# Patient Record
Sex: Female | Born: 1970 | Race: White | Hispanic: No | Marital: Married | Smoking: Current every day smoker
Health system: Southern US, Community
[De-identification: ages and names within clinical notes are randomized; demographics above are authoritative.]

## PROBLEM LIST (undated history)

## (undated) DIAGNOSIS — M797 Fibromyalgia: Secondary | ICD-10-CM

## (undated) DIAGNOSIS — R112 Nausea with vomiting, unspecified: Secondary | ICD-10-CM

## (undated) DIAGNOSIS — Z9889 Other specified postprocedural states: Secondary | ICD-10-CM

## (undated) DIAGNOSIS — Z973 Presence of spectacles and contact lenses: Secondary | ICD-10-CM

## (undated) DIAGNOSIS — M1990 Unspecified osteoarthritis, unspecified site: Secondary | ICD-10-CM

## (undated) DIAGNOSIS — R350 Frequency of micturition: Secondary | ICD-10-CM

## (undated) DIAGNOSIS — Z8701 Personal history of pneumonia (recurrent): Secondary | ICD-10-CM

## (undated) DIAGNOSIS — F410 Panic disorder [episodic paroxysmal anxiety] without agoraphobia: Secondary | ICD-10-CM

## (undated) HISTORY — PX: TONSILLECTOMY: SUR1361

## (undated) HISTORY — PX: EXTERNAL EAR SURGERY: SHX627

## (undated) HISTORY — PX: TUBAL LIGATION: SHX77

## (undated) HISTORY — PX: KNEE ARTHROSCOPY: SUR90

## (undated) HISTORY — PX: INNER EAR SURGERY: SHX679

---

## 2016-06-24 ENCOUNTER — Other Ambulatory Visit: Payer: Self-pay | Admitting: Orthopedic Surgery

## 2016-07-12 NOTE — Pre-Procedure Instructions (Signed)
Michaela Lawson  07/12/2016      [facility] [place] [address] [place] [address] Phone: [phone] Fax: [phone]    Your procedure is scheduled on Friday, August 18th, 2017.  [facility] Admitting at 11:00 A.M.   Call this number if you have problems the morning of surgery:  [phone]   Remember:  Do not eat food or drink liquids after midnight.   Take these medicines the morning of surgery with A SIP OF WATER: Cyclobenzaprine (Flexeril) if needed.  7 days prior to surgery, stop taking: Meloxicam (Mobic), Aspirin, NSAIDS, Aleve, Naproxen, Ibuprofen, Advil, Motrin, BC's, Goody's, Fish oil, all herbal medications, and all vitamins.    Do not wear jewelry, make-up or nail polish.  Do not wear lotions, powders, or perfumes.   Do not shave 48 hours prior to surgery.    Do not bring valuables to the hospital.  [facility] is not responsible for any belongings or valuables.  Contacts, dentures or bridgework may not be worn into surgery.  Leave your suitcase in the car.  After surgery it may be brought to your room.  For patients admitted to the hospital, discharge time will be determined by your treatment team.  Patients discharged the day of surgery will not be allowed to drive home.   Special instructions:  Preparing for Surgery.   Please read over the following fact sheets that you were given. MRSA Information    [facility]- Preparing For Surgery  Before surgery, you can play an important role. Because skin is not sterile, your skin needs to be as free of germs as possible. You can reduce the number of germs on your skin by washing with CHG (chlorahexidine gluconate) Soap before surgery.  CHG is an antiseptic cleaner which kills germs and bonds with the skin to continue killing germs even after washing.  Please do not use if you have an allergy to CHG or antibacterial soaps. If your  skin becomes reddened/irritated stop using the CHG.  Do not shave (including legs and underarms) for at least 48 hours prior to first CHG shower. It is OK to shave your face.  Please follow these instructions carefully.   1. Shower the NIGHT BEFORE SURGERY and the MORNING OF SURGERY with CHG.   2. If you chose to wash your hair, wash your hair first as usual with your normal shampoo.  3. After you shampoo, rinse your hair and body thoroughly to remove the shampoo.  4. Use CHG as you would any other liquid soap. You can apply CHG directly to the skin and wash gently with a scrungie or a clean washcloth.   5. Apply the CHG Soap to your body ONLY FROM THE NECK DOWN.  Do not use on open wounds or open sores. Avoid contact with your eyes, ears, mouth and genitals (private parts). Wash genitals (private parts) with your normal soap.  6. Wash thoroughly, paying special attention to the area where your surgery will be performed.  7. Thoroughly rinse your body with warm water from the neck down.  8. DO NOT shower/wash with your normal soap after using and rinsing off the CHG Soap.  9. Pat yourself dry with a CLEAN TOWEL.   10. Wear CLEAN PAJAMAS   11. Place CLEAN SHEETS on your bed the night of your first shower and DO NOT SLEEP WITH PETS.  Day of Surgery: Do not apply  any deodorants/lotions. Please wear clean clothes to the hospital/surgery center.

## 2016-07-13 ENCOUNTER — Encounter (HOSPITAL_COMMUNITY): Payer: Self-pay | Admitting: General Practice

## 2016-07-13 ENCOUNTER — Encounter (HOSPITAL_COMMUNITY)
Admission: RE | Admit: 2016-07-13 | Discharge: 2016-07-13 | Disposition: A | Discharge status: Home / Self Care | Payer: Worker's Compensation | Source: Ambulatory Visit | Attending: Orthopedic Surgery | Admitting: Orthopedic Surgery

## 2016-07-13 ENCOUNTER — Ambulatory Visit (HOSPITAL_COMMUNITY)
Admission: RE | Admit: 2016-07-13 | Discharge: 2016-07-13 | Disposition: A | Discharge status: Home / Self Care | Payer: Worker's Compensation | Source: Ambulatory Visit | Attending: Orthopedic Surgery | Admitting: Orthopedic Surgery

## 2016-07-13 DIAGNOSIS — Z01818 Encounter for other preprocedural examination: Secondary | ICD-10-CM

## 2016-07-13 DIAGNOSIS — Z01810 Encounter for preprocedural cardiovascular examination: Secondary | ICD-10-CM | POA: Diagnosis not present

## 2016-07-13 HISTORY — DX: Fibromyalgia: M79.7

## 2016-07-13 HISTORY — DX: Other specified postprocedural states: R11.2

## 2016-07-13 HISTORY — DX: Other specified postprocedural states: Z98.890

## 2016-07-13 HISTORY — DX: Presence of spectacles and contact lenses: Z97.3

## 2016-07-13 HISTORY — DX: Unspecified osteoarthritis, unspecified site: M19.90

## 2016-07-13 HISTORY — DX: Panic disorder (episodic paroxysmal anxiety): F41.0

## 2016-07-13 HISTORY — DX: Personal history of pneumonia (recurrent): Z87.01

## 2016-07-13 HISTORY — DX: Frequency of micturition: R35.0

## 2016-07-13 LAB — URINALYSIS, ROUTINE W REFLEX MICROSCOPIC
BILIRUBIN URINE: NEGATIVE
GLUCOSE, UA: NEGATIVE mg/dL
HGB URINE DIPSTICK: NEGATIVE
KETONES UR: NEGATIVE mg/dL
Leukocytes, UA: NEGATIVE
Nitrite: NEGATIVE
PROTEIN: NEGATIVE mg/dL
Specific Gravity, Urine: 1.002 — ABNORMAL LOW (ref 1.005–1.030)
pH: 7.5 (ref 5.0–8.0)

## 2016-07-13 LAB — BASIC METABOLIC PANEL
ANION GAP: 9 (ref 5–15)
BUN: 6 mg/dL (ref 6–20)
CALCIUM: 9 mg/dL (ref 8.9–10.3)
CO2: 27 mmol/L (ref 22–32)
Chloride: 101 mmol/L (ref 101–111)
Creatinine, Ser: 0.83 mg/dL (ref 0.44–1.00)
GLUCOSE: 141 mg/dL — AB (ref 65–99)
POTASSIUM: 3.8 mmol/L (ref 3.5–5.1)
Sodium: 137 mmol/L (ref 135–145)

## 2016-07-13 LAB — CBC WITH DIFFERENTIAL/PLATELET
BASOS ABS: 0 10*3/uL (ref 0.0–0.1)
BASOS PCT: 0 %
Eosinophils Absolute: 0.1 10*3/uL (ref 0.0–0.7)
Eosinophils Relative: 1 %
HEMATOCRIT: 43.5 % (ref 36.0–46.0)
HEMOGLOBIN: 15.1 g/dL — AB (ref 12.0–15.0)
LYMPHS PCT: 22 %
Lymphs Abs: 2 10*3/uL (ref 0.7–4.0)
MCH: 34.9 pg — ABNORMAL HIGH (ref 26.0–34.0)
MCHC: 34.7 g/dL (ref 30.0–36.0)
MCV: 100.5 fL — AB (ref 78.0–100.0)
MONO ABS: 0.4 10*3/uL (ref 0.1–1.0)
MONOS PCT: 5 %
NEUTROS ABS: 6.8 10*3/uL (ref 1.7–7.7)
NEUTROS PCT: 72 %
Platelets: 345 10*3/uL (ref 150–400)
RBC: 4.33 MIL/uL (ref 3.87–5.11)
RDW: 12.5 % (ref 11.5–15.5)
WBC: 9.3 10*3/uL (ref 4.0–10.5)

## 2016-07-13 LAB — TYPE AND SCREEN
ABO/RH(D): A POS
Antibody Screen: NEGATIVE

## 2016-07-13 LAB — PROTIME-INR
INR: 0.95
Prothrombin Time: 12.7 seconds (ref 11.4–15.2)

## 2016-07-13 LAB — ABO/RH: ABO/RH(D): A POS

## 2016-07-13 LAB — SURGICAL PCR SCREEN
MRSA, PCR: NEGATIVE
Staphylococcus aureus: NEGATIVE

## 2016-07-13 LAB — APTT: aPTT: 30 seconds (ref 24–36)

## 2016-07-13 LAB — HCG, SERUM, QUALITATIVE: PREG SERUM: NEGATIVE

## 2016-07-13 NOTE — Progress Notes (Signed)
PCP - Dr. Crista ElliotJames McGrath - pt. States that she has not seen him since 2014 Cardiologist - denies  EKG - 07/13/16 CXR - 07/13/16  Echo/stress test/Cardiac Cath - pt. Denies  Patient denies chest pain and shortness of breath at PAT appointment.

## 2016-07-18 DIAGNOSIS — M1611 Unilateral primary osteoarthritis, right hip: Secondary | ICD-10-CM | POA: Diagnosis present

## 2016-07-21 NOTE — H&P (Signed)
TOTAL HIP ADMISSION H&P  Patient is admitted for right total hip arthroplasty.  Subjective:  Chief Complaint: right hip pain  HPI: Michaela Lawson, 45 y.o. female, has a history of pain and functional disability in the right hip(s) due to trauma and arthritis and patient has failed non-surgical conservative treatments for greater than 12 weeks to include NSAID's and/or analgesics, corticosteriod injections, flexibility and strengthening excercises, use of assistive devices, weight reduction as appropriate and activity modification.  Onset of symptoms was abrupt starting 1 years ago with rapidlly worsening course since that time.The patient noted no past surgery on the right hip(s).  Patient currently rates pain in the right hip at 10 out of 10 with activity. Patient has night pain, worsening of pain with activity and weight bearing, pain that interfers with activities of daily living, pain with passive range of motion and crepitus. Patient has evidence of subchondral cysts and joint space narrowing by imaging studies. This condition presents safety issues increasing the risk of falls.   There is no current active infection.  Patient Active Problem List   Diagnosis Date Noted  . Primary localized osteoarthritis of right hip 07/18/2016   Past Medical History:  Diagnosis Date  . Arthritis   . Fibromyalgia   . History of pneumonia   . Panic attacks   . PONV (postoperative nausea and vomiting)   . Urinary frequency   . Wears glasses     Past Surgical History:  Procedure Laterality Date  . EXTERNAL EAR SURGERY Right   . INNER EAR SURGERY     x2  . KNEE ARTHROSCOPY Left   . TONSILLECTOMY    . TUBAL LIGATION      No prescriptions prior to admission.   No Known Allergies  Social History  Substance Use Topics  . Smoking status: Current Every Day Smoker    Packs/day: 1.00    Types: Cigarettes  . Smokeless tobacco: Never Used  . Alcohol use 3.6 - 4.8 oz/week    6 - 8 Cans of beer per week     Comment: weekends    No family history on file.   Review of Systems  Constitutional: Positive for malaise/fatigue.  HENT: Positive for tinnitus.   Eyes: Positive for blurred vision.  Respiratory: Positive for shortness of breath.   Cardiovascular: Negative.   Gastrointestinal:       Irritable bowel  Genitourinary: Positive for frequency.  Musculoskeletal: Positive for joint pain and myalgias.  Neurological: Positive for dizziness, focal weakness and headaches.  Endo/Heme/Allergies: Bruises/bleeds easily.  Psychiatric/Behavioral: Positive for depression. The patient is nervous/anxious.     Objective:  Physical Exam  Constitutional: She is oriented to person, place, and time. She appears well-developed and well-nourished.  HENT:  Head: Normocephalic and atraumatic.  Eyes: Pupils are equal, round, and reactive to light.  Neck: Normal range of motion. Neck supple.  Cardiovascular: Intact distal pulses.   Respiratory: Effort normal.  Musculoskeletal: She exhibits tenderness.  she does continue to have moderate irritation with hip flexion extension internal and external rotation and log roll of the right hip.  Increased pain with palpation of the right groin.  Patient's left hip continues to have good strength and good range of motion.  Neurological: She is alert and oriented to person, place, and time.  Skin: Skin is warm and dry.  Psychiatric: She has a normal mood and affect. Her behavior is normal. Judgment and thought content normal.    Vital signs in last 24 hours:  Labs:   Estimated body mass index is 19.92 kg/m as calculated from the following:   Height as of 07/13/16: 5\' 5"  (1.651 m).   Weight as of 07/13/16: 54.3 kg (119 lb 11.2 oz).   Imaging Review She did have an MRI previously showing a superior labral tear with parallel labral cyst and moderate arthritis.    Assessment/Plan:  End stage arthritis, right hip(s)  The patient history, physical examination,  clinical judgement of the provider and imaging studies are consistent with end stage degenerative joint disease of the right hip(s) and total hip arthroplasty is deemed medically necessary. The treatment options including medical management, injection therapy, arthroscopy and arthroplasty were discussed at length. The risks and benefits of total hip arthroplasty were presented and reviewed. The risks due to aseptic loosening, infection, stiffness, dislocation/subluxation,  thromboembolic complications and other imponderables were discussed.  The patient acknowledged the explanation, agreed to proceed with the plan and consent was signed. Patient is being admitted for inpatient treatment for surgery, pain control, PT, OT, prophylactic antibiotics, VTE prophylaxis, progressive ambulation and ADL's and discharge planning.The patient is planning to be discharged home with home health services

## 2016-07-22 MED ORDER — BUPIVACAINE LIPOSOME 1.3 % IJ SUSP
20.0000 mL | Freq: Once | INTRAMUSCULAR | Status: DC
  Filled 2016-07-22: qty 20

## 2016-07-22 MED ORDER — TRANEXAMIC ACID 1000 MG/10ML IV SOLN
1000.0000 mg | INTRAVENOUS | Status: AC
  Administered 2016-07-23: 1000 mg via INTRAVENOUS
  Filled 2016-07-22: qty 10

## 2016-07-22 MED ORDER — TRANEXAMIC ACID 1000 MG/10ML IV SOLN
2000.0000 mg | Freq: Once | INTRAVENOUS | Status: DC
  Filled 2016-07-22: qty 20

## 2016-07-22 NOTE — Anesthesia Preprocedure Evaluation (Addendum)
Anesthesia Evaluation  Patient identified by MRN, date of birth, ID band Patient awake    Reviewed: Allergy & Precautions, NPO status , Patient's Chart, lab work & pertinent test results  History of Anesthesia Complications (+) PONV and history of anesthetic complications (patient denies PONV but listed in chart)  Airway Mallampati: III  TM Distance: >3 FB Neck ROM: Full    Dental  (+) Dental Advisory Given,    Pulmonary neg shortness of breath, neg sleep apnea, neg COPD, neg recent URI, Current Smoker,    Pulmonary exam normal breath sounds clear to auscultation       Cardiovascular (-) hypertension(-) angina(-) Past MI, (-) Cardiac Stents, (-) CABG, (-) Orthopnea and (-) PND  Rhythm:Regular Rate:Normal  EKG 07/13/2016: NSR   Neuro/Psych neg Seizures PSYCHIATRIC DISORDERS Anxiety Panic attacks   GI/Hepatic negative GI ROS, Neg liver ROS, neg GERD  ,  Endo/Other  negative endocrine ROSneg diabetes  Renal/GU negative Renal ROS  Female GU complaint (frequency)     Musculoskeletal  (+) Arthritis , Osteoarthritis,  Fibromyalgia -  Abdominal (+) - obese,   Peds  Hematology negative hematology ROS (+)   Anesthesia Other Findings   Reproductive/Obstetrics                            Anesthesia Physical Anesthesia Plan  ASA: II  Anesthesia Plan: Spinal   Post-op Pain Management:    Induction: Intravenous  Airway Management Planned: Natural Airway and Nasal Cannula  Additional Equipment:   Intra-op Plan:   Post-operative Plan:   Informed Consent: I have reviewed the patients History and Physical, chart, labs and discussed the procedure including the risks, benefits and alternatives for the proposed anesthesia with the patient or authorized representative who has indicated his/her understanding and acceptance.   Dental advisory given  Plan Discussed with:   Anesthesia Plan Comments: (I  have discussed risks of neuraxial anesthesia including but not limited to infection, bleeding, nerve injury, back pain, headache, seizures, and failure of block. Patient denies bleeding disorders and is not currently anticoagulated. Labs have been reviewed. Risks and benefits discussed. All patient's questions answered.   Hgb 15.1 Platelets 345 INR 0.95)       Anesthesia Quick Evaluation

## 2016-07-23 ENCOUNTER — Encounter (HOSPITAL_COMMUNITY): Payer: Self-pay | Admitting: Surgery

## 2016-07-23 ENCOUNTER — Encounter (HOSPITAL_COMMUNITY)
Admission: RE | Disposition: A | Discharge status: Home Health | Payer: Self-pay | Source: Ambulatory Visit | Attending: Orthopedic Surgery

## 2016-07-23 ENCOUNTER — Inpatient Hospital Stay (HOSPITAL_COMMUNITY): Payer: Worker's Compensation | Admitting: Anesthesiology

## 2016-07-23 ENCOUNTER — Inpatient Hospital Stay (HOSPITAL_COMMUNITY): Payer: Worker's Compensation

## 2016-07-23 ENCOUNTER — Inpatient Hospital Stay (HOSPITAL_COMMUNITY)
Admission: RE | Admit: 2016-07-23 | Discharge: 2016-07-25 | DRG: 470 | Disposition: A | Discharge status: Home Health | Payer: Worker's Compensation | Source: Ambulatory Visit | Attending: Orthopedic Surgery | Admitting: Orthopedic Surgery

## 2016-07-23 DIAGNOSIS — M1611 Unilateral primary osteoarthritis, right hip: Principal | ICD-10-CM | POA: Diagnosis present

## 2016-07-23 DIAGNOSIS — F410 Panic disorder [episodic paroxysmal anxiety] without agoraphobia: Secondary | ICD-10-CM | POA: Diagnosis present

## 2016-07-23 DIAGNOSIS — F17200 Nicotine dependence, unspecified, uncomplicated: Secondary | ICD-10-CM | POA: Diagnosis present

## 2016-07-23 DIAGNOSIS — Z8701 Personal history of pneumonia (recurrent): Secondary | ICD-10-CM | POA: Diagnosis not present

## 2016-07-23 DIAGNOSIS — M797 Fibromyalgia: Secondary | ICD-10-CM | POA: Diagnosis present

## 2016-07-23 DIAGNOSIS — Z419 Encounter for procedure for purposes other than remedying health state, unspecified: Secondary | ICD-10-CM

## 2016-07-23 HISTORY — PX: TOTAL HIP ARTHROPLASTY: SHX124

## 2016-07-23 SURGERY — ARTHROPLASTY, HIP, TOTAL, ANTERIOR APPROACH
Anesthesia: Spinal | Site: Hip | Laterality: Right

## 2016-07-23 MED ORDER — MIDAZOLAM HCL 5 MG/5ML IJ SOLN
INTRAMUSCULAR | Status: DC | PRN
  Administered 2016-07-23: 2 mg via INTRAVENOUS

## 2016-07-23 MED ORDER — ROCURONIUM BROMIDE 100 MG/10ML IV SOLN
INTRAVENOUS | Status: DC | PRN
  Administered 2016-07-23: 20 mg via INTRAVENOUS
  Administered 2016-07-23: 10 mg via INTRAVENOUS

## 2016-07-23 MED ORDER — BUPIVACAINE HCL (PF) 0.5 % IJ SOLN
INTRAMUSCULAR | Status: DC | PRN
  Administered 2016-07-23: 12.5 mg via INTRATHECAL

## 2016-07-23 MED ORDER — PROPOFOL 10 MG/ML IV BOLUS
INTRAVENOUS | Status: DC | PRN
  Administered 2016-07-23: 120 mg via INTRAVENOUS

## 2016-07-23 MED ORDER — BISACODYL 5 MG PO TBEC: 5.0000 mg | DELAYED_RELEASE_TABLET | Freq: Every day | ORAL | Status: DC | PRN

## 2016-07-23 MED ORDER — METOCLOPRAMIDE HCL 5 MG PO TABS: 5.0000 mg | ORAL_TABLET | Freq: Three times a day (TID) | ORAL | Status: DC | PRN

## 2016-07-23 MED ORDER — PNEUMOCOCCAL VAC POLYVALENT 25 MCG/0.5ML IJ INJ: 0.5000 mL | INJECTION | INTRAMUSCULAR | Status: DC

## 2016-07-23 MED ORDER — BUPIVACAINE-EPINEPHRINE (PF) 0.25% -1:200000 IJ SOLN
INTRAMUSCULAR | Status: AC
  Filled 2016-07-23: qty 30

## 2016-07-23 MED ORDER — PROPOFOL 500 MG/50ML IV EMUL
INTRAVENOUS | Status: DC | PRN
  Administered 2016-07-23: 25 ug/kg/min via INTRAVENOUS

## 2016-07-23 MED ORDER — FLEET ENEMA 7-19 GM/118ML RE ENEM: 1.0000 | ENEMA | Freq: Once | RECTAL | Status: DC | PRN

## 2016-07-23 MED ORDER — ACETAMINOPHEN 650 MG RE SUPP: 650.0000 mg | Freq: Four times a day (QID) | RECTAL | Status: DC | PRN

## 2016-07-23 MED ORDER — BUPIVACAINE LIPOSOME 1.3 % IJ SUSP
INTRAMUSCULAR | Status: DC | PRN
  Administered 2016-07-23: 20 mL

## 2016-07-23 MED ORDER — LACTATED RINGERS IV SOLN
INTRAVENOUS | Status: DC
  Administered 2016-07-23 (×2): via INTRAVENOUS

## 2016-07-23 MED ORDER — FENTANYL CITRATE (PF) 100 MCG/2ML IJ SOLN
INTRAMUSCULAR | Status: AC
Start: 2016-07-23 — End: 2016-07-23
  Filled 2016-07-23: qty 2

## 2016-07-23 MED ORDER — DEXTROSE-NACL 5-0.45 % IV SOLN: INTRAVENOUS | Status: DC

## 2016-07-23 MED ORDER — FENTANYL CITRATE (PF) 100 MCG/2ML IJ SOLN
INTRAMUSCULAR | Status: AC
  Filled 2016-07-23: qty 2

## 2016-07-23 MED ORDER — SUCCINYLCHOLINE CHLORIDE 200 MG/10ML IV SOSY
PREFILLED_SYRINGE | INTRAVENOUS | Status: AC
  Filled 2016-07-23: qty 10

## 2016-07-23 MED ORDER — METHOCARBAMOL 1000 MG/10ML IJ SOLN
500.0000 mg | Freq: Four times a day (QID) | INTRAVENOUS | Status: DC | PRN
  Filled 2016-07-23: qty 5

## 2016-07-23 MED ORDER — METHOCARBAMOL 500 MG PO TABS
500.0000 mg | ORAL_TABLET | Freq: Four times a day (QID) | ORAL | Status: DC | PRN
  Administered 2016-07-23 – 2016-07-24 (×2): 500 mg via ORAL
  Filled 2016-07-23 (×3): qty 1

## 2016-07-23 MED ORDER — 0.9 % SODIUM CHLORIDE (POUR BTL) OPTIME
TOPICAL | Status: DC | PRN
  Administered 2016-07-23: 1000 mL

## 2016-07-23 MED ORDER — KCL IN DEXTROSE-NACL 20-5-0.45 MEQ/L-%-% IV SOLN
INTRAVENOUS | Status: DC
  Filled 2016-07-23: qty 1000

## 2016-07-23 MED ORDER — PHENOL 1.4 % MT LIQD
1.0000 | OROMUCOSAL | Status: DC | PRN
Start: 2016-07-23 — End: 2016-07-25

## 2016-07-23 MED ORDER — CHLORHEXIDINE GLUCONATE 4 % EX LIQD: 60.0000 mL | Freq: Once | CUTANEOUS | Status: DC

## 2016-07-23 MED ORDER — OXYCODONE HCL 5 MG PO TABS
5.0000 mg | ORAL_TABLET | ORAL | Status: DC | PRN
  Administered 2016-07-23 – 2016-07-25 (×11): 10 mg via ORAL
  Filled 2016-07-23 (×12): qty 2

## 2016-07-23 MED ORDER — ROCURONIUM BROMIDE 10 MG/ML (PF) SYRINGE
PREFILLED_SYRINGE | INTRAVENOUS | Status: AC
  Filled 2016-07-23: qty 10

## 2016-07-23 MED ORDER — LIDOCAINE 2% (20 MG/ML) 5 ML SYRINGE
INTRAMUSCULAR | Status: AC
  Filled 2016-07-23: qty 5

## 2016-07-23 MED ORDER — SUGAMMADEX SODIUM 200 MG/2ML IV SOLN
INTRAVENOUS | Status: AC
  Filled 2016-07-23: qty 2

## 2016-07-23 MED ORDER — ACETAMINOPHEN 325 MG PO TABS
650.0000 mg | ORAL_TABLET | Freq: Four times a day (QID) | ORAL | Status: DC | PRN
  Administered 2016-07-23 – 2016-07-24 (×4): 650 mg via ORAL
  Filled 2016-07-23 (×4): qty 2

## 2016-07-23 MED ORDER — TRANEXAMIC ACID 1000 MG/10ML IV SOLN
INTRAVENOUS | Status: DC | PRN
  Administered 2016-07-23: 2000 mg via TOPICAL

## 2016-07-23 MED ORDER — BUPIVACAINE-EPINEPHRINE 0.25% -1:200000 IJ SOLN
INTRAMUSCULAR | Status: DC | PRN
  Administered 2016-07-23: 50 mL

## 2016-07-23 MED ORDER — FENTANYL CITRATE (PF) 100 MCG/2ML IJ SOLN
INTRAMUSCULAR | Status: DC | PRN
  Administered 2016-07-23 (×3): 50 ug via INTRAVENOUS
  Administered 2016-07-23 (×2): 25 ug via INTRAVENOUS

## 2016-07-23 MED ORDER — HYDROMORPHONE HCL 1 MG/ML IJ SOLN: 0.5000 mg | INTRAMUSCULAR | Status: DC | PRN

## 2016-07-23 MED ORDER — ASPIRIN EC 325 MG PO TBEC
325.0000 mg | DELAYED_RELEASE_TABLET | Freq: Every day | ORAL | Status: DC
  Administered 2016-07-24 – 2016-07-25 (×2): 325 mg via ORAL
  Filled 2016-07-23 (×2): qty 1

## 2016-07-23 MED ORDER — SUCCINYLCHOLINE CHLORIDE 20 MG/ML IJ SOLN
INTRAMUSCULAR | Status: DC | PRN
  Administered 2016-07-23: 60 mg via INTRAVENOUS

## 2016-07-23 MED ORDER — OXYCODONE-ACETAMINOPHEN 5-325 MG PO TABS: 1.0000 | ORAL_TABLET | ORAL | 0 refills | Status: AC | PRN

## 2016-07-23 MED ORDER — ONDANSETRON HCL 4 MG PO TABS: 4.0000 mg | ORAL_TABLET | Freq: Four times a day (QID) | ORAL | Status: DC | PRN

## 2016-07-23 MED ORDER — LIDOCAINE 2% (20 MG/ML) 5 ML SYRINGE
INTRAMUSCULAR | Status: DC | PRN
  Administered 2016-07-23: 60 mg via INTRAVENOUS

## 2016-07-23 MED ORDER — FENTANYL CITRATE (PF) 100 MCG/2ML IJ SOLN: 25.0000 ug | INTRAMUSCULAR | Status: DC | PRN

## 2016-07-23 MED ORDER — METHOCARBAMOL 500 MG PO TABS: 500.0000 mg | ORAL_TABLET | Freq: Two times a day (BID) | ORAL | 0 refills | Status: AC

## 2016-07-23 MED ORDER — DIPHENHYDRAMINE HCL 12.5 MG/5ML PO ELIX
12.5000 mg | ORAL_SOLUTION | ORAL | Status: DC | PRN
  Administered 2016-07-24: 25 mg via ORAL
  Filled 2016-07-23: qty 10

## 2016-07-23 MED ORDER — DEXAMETHASONE SODIUM PHOSPHATE 10 MG/ML IJ SOLN
INTRAMUSCULAR | Status: DC | PRN
  Administered 2016-07-23: 5 mg via INTRAVENOUS

## 2016-07-23 MED ORDER — ONDANSETRON HCL 4 MG/2ML IJ SOLN
INTRAMUSCULAR | Status: DC | PRN
  Administered 2016-07-23: 4 mg via INTRAVENOUS

## 2016-07-23 MED ORDER — SUGAMMADEX SODIUM 200 MG/2ML IV SOLN
INTRAVENOUS | Status: DC | PRN
  Administered 2016-07-23: 100 mg via INTRAVENOUS

## 2016-07-23 MED ORDER — KCL IN DEXTROSE-NACL 20-5-0.45 MEQ/L-%-% IV SOLN
INTRAVENOUS | Status: AC
  Filled 2016-07-23: qty 1000

## 2016-07-23 MED ORDER — CEFAZOLIN SODIUM-DEXTROSE 2-4 GM/100ML-% IV SOLN
INTRAVENOUS | Status: AC
  Filled 2016-07-23: qty 100

## 2016-07-23 MED ORDER — DOCUSATE SODIUM 100 MG PO CAPS
100.0000 mg | ORAL_CAPSULE | Freq: Two times a day (BID) | ORAL | Status: DC
  Administered 2016-07-23 – 2016-07-25 (×4): 100 mg via ORAL
  Filled 2016-07-23 (×4): qty 1

## 2016-07-23 MED ORDER — METOCLOPRAMIDE HCL 5 MG/ML IJ SOLN: 5.0000 mg | Freq: Three times a day (TID) | INTRAMUSCULAR | Status: DC | PRN

## 2016-07-23 MED ORDER — ONDANSETRON HCL 4 MG/2ML IJ SOLN
INTRAMUSCULAR | Status: AC
  Filled 2016-07-23: qty 2

## 2016-07-23 MED ORDER — ONDANSETRON HCL 4 MG/2ML IJ SOLN: 4.0000 mg | Freq: Four times a day (QID) | INTRAMUSCULAR | Status: DC | PRN

## 2016-07-23 MED ORDER — ASPIRIN EC 325 MG PO TBEC: 325.0000 mg | DELAYED_RELEASE_TABLET | Freq: Two times a day (BID) | ORAL | 0 refills | Status: AC

## 2016-07-23 MED ORDER — CEFAZOLIN SODIUM-DEXTROSE 2-4 GM/100ML-% IV SOLN
2.0000 g | INTRAVENOUS | Status: AC
  Administered 2016-07-23: 2 g via INTRAVENOUS

## 2016-07-23 MED ORDER — HYDROMORPHONE HCL 1 MG/ML IJ SOLN
0.5000 mg | INTRAMUSCULAR | Status: DC | PRN
  Administered 2016-07-23 – 2016-07-24 (×4): 0.5 mg via INTRAVENOUS
  Filled 2016-07-23 (×5): qty 1

## 2016-07-23 MED ORDER — MENTHOL 3 MG MT LOZG: 1.0000 | LOZENGE | OROMUCOSAL | Status: DC | PRN

## 2016-07-23 MED ORDER — PROMETHAZINE HCL 25 MG/ML IJ SOLN
6.2500 mg | INTRAMUSCULAR | Status: DC | PRN
Start: 2016-07-23 — End: 2016-07-23

## 2016-07-23 MED ORDER — SENNOSIDES-DOCUSATE SODIUM 8.6-50 MG PO TABS: 1.0000 | ORAL_TABLET | Freq: Every evening | ORAL | Status: DC | PRN

## 2016-07-23 MED ORDER — MIDAZOLAM HCL 2 MG/2ML IJ SOLN
INTRAMUSCULAR | Status: AC
  Filled 2016-07-23: qty 2

## 2016-07-23 MED ORDER — PHENYLEPHRINE 40 MCG/ML (10ML) SYRINGE FOR IV PUSH (FOR BLOOD PRESSURE SUPPORT)
PREFILLED_SYRINGE | INTRAVENOUS | Status: DC | PRN
  Administered 2016-07-23: 80 ug via INTRAVENOUS

## 2016-07-23 MED ORDER — DEXAMETHASONE SODIUM PHOSPHATE 10 MG/ML IJ SOLN
10.0000 mg | Freq: Once | INTRAMUSCULAR | Status: AC
  Administered 2016-07-24: 10 mg via INTRAVENOUS
  Filled 2016-07-23: qty 1

## 2016-07-23 MED ORDER — ALUM & MAG HYDROXIDE-SIMETH 200-200-20 MG/5ML PO SUSP: 30.0000 mL | ORAL | Status: DC | PRN

## 2016-07-23 SURGICAL SUPPLY — 45 items
BLADE SURG ROTATE 9660 (MISCELLANEOUS) IMPLANT
CAPT HIP TOTAL 2 ×3 IMPLANT
COVER PERINEAL POST (MISCELLANEOUS) ×3 IMPLANT
COVER SURGICAL LIGHT HANDLE (MISCELLANEOUS) ×3 IMPLANT
DRAPE C-ARM 42X72 X-RAY (DRAPES) ×3 IMPLANT
DRAPE STERI IOBAN 125X83 (DRAPES) ×3 IMPLANT
DRAPE U-SHAPE 47X51 STRL (DRAPES) ×6 IMPLANT
DRSG AQUACEL AG ADV 3.5X 6 (GAUZE/BANDAGES/DRESSINGS) ×3 IMPLANT
DRSG AQUACEL AG ADV 3.5X10 (GAUZE/BANDAGES/DRESSINGS) ×3 IMPLANT
DURAPREP 26ML APPLICATOR (WOUND CARE) ×3 IMPLANT
ELECT BLADE 4.0 EZ CLEAN MEGAD (MISCELLANEOUS) ×3
ELECT REM PT RETURN 9FT ADLT (ELECTROSURGICAL) ×3
ELECTRODE BLDE 4.0 EZ CLN MEGD (MISCELLANEOUS) ×1 IMPLANT
ELECTRODE REM PT RTRN 9FT ADLT (ELECTROSURGICAL) ×1 IMPLANT
FACESHIELD WRAPAROUND (MASK) ×6 IMPLANT
GLOVE BIO SURGEON STRL SZ7.5 (GLOVE) ×3 IMPLANT
GLOVE BIO SURGEON STRL SZ8.5 (GLOVE) ×3 IMPLANT
GLOVE BIOGEL PI IND STRL 8 (GLOVE) ×1 IMPLANT
GLOVE BIOGEL PI IND STRL 9 (GLOVE) ×1 IMPLANT
GLOVE BIOGEL PI INDICATOR 8 (GLOVE) ×2
GLOVE BIOGEL PI INDICATOR 9 (GLOVE) ×2
GOWN STRL REUS W/ TWL LRG LVL3 (GOWN DISPOSABLE) ×1 IMPLANT
GOWN STRL REUS W/ TWL XL LVL3 (GOWN DISPOSABLE) ×2 IMPLANT
GOWN STRL REUS W/TWL LRG LVL3 (GOWN DISPOSABLE) ×2
GOWN STRL REUS W/TWL XL LVL3 (GOWN DISPOSABLE) ×4
KIT BASIN OR (CUSTOM PROCEDURE TRAY) ×3 IMPLANT
KIT ROOM TURNOVER OR (KITS) ×3 IMPLANT
MANIFOLD NEPTUNE II (INSTRUMENTS) ×3 IMPLANT
NEEDLE 22X1 1/2 OR ONLY (MISCELLANEOUS) ×4
NEEDLE 22X1.5 STRL (OR ONLY) (MISCELLANEOUS) ×2 IMPLANT
NS IRRIG 1000ML POUR BTL (IV SOLUTION) ×3 IMPLANT
PACK TOTAL JOINT (CUSTOM PROCEDURE TRAY) ×3 IMPLANT
PAD ARMBOARD 7.5X6 YLW CONV (MISCELLANEOUS) ×6 IMPLANT
SAW OSC TIP CART 19.5X105X1.3 (SAW) ×3 IMPLANT
SUT VIC AB 1 CTX 36 (SUTURE) ×2
SUT VIC AB 1 CTX36XBRD ANBCTR (SUTURE) ×1 IMPLANT
SUT VIC AB 2-0 CT1 27 (SUTURE) ×4
SUT VIC AB 2-0 CT1 TAPERPNT 27 (SUTURE) ×2 IMPLANT
SUT VIC AB 3-0 PS2 18 (SUTURE) ×2
SUT VIC AB 3-0 PS2 18XBRD (SUTURE) ×1 IMPLANT
SYR CONTROL 10ML LL (SYRINGE) ×6 IMPLANT
TOWEL OR 17X24 6PK STRL BLUE (TOWEL DISPOSABLE) ×3 IMPLANT
TOWEL OR 17X26 10 PK STRL BLUE (TOWEL DISPOSABLE) ×3 IMPLANT
TRAY FOLEY CATH 14FR (SET/KITS/TRAYS/PACK) IMPLANT
WATER STERILE IRR 1000ML POUR (IV SOLUTION) ×3 IMPLANT

## 2016-07-23 NOTE — Discharge Instructions (Signed)

## 2016-07-23 NOTE — Op Note (Signed)
OPERATIVE REPORT    DATE OF PROCEDURE:  07/23/2016       PREOPERATIVE DIAGNOSIS:  RIGHT HIP OSTEOARTHRITIS                                                          POSTOPERATIVE DIAGNOSIS:  Right hip Osteoarthritis                                                           PROCEDURE: Anterior L total hip arthroplasty using a 48 mm DePuy Pinnacle  Cup, Peabody Energypex Hole Eliminator, 0-degree polyethylene liner, a +1.5 32 mm ceramic head, a 2 Depuy Triloc stem   SURGEON: Tzippy Testerman J    ASSISTANT:   Eric K. [employer] PA-C  (present throughout entire procedure and necessary for timely completion of the procedure)   ANESTHESIA: GLMA, spinal BLOOD LOSS: 300 FLUID REPLACEMENT: 1500 crystalloid Antibiotic: 2gm ancef Tranexamic Acid: 1gm iv, 2gm topical COMPLICATIONS: none    INDICATIONS FOR PROCEDURE: A 45 y.o. year-old With  RIGHT HIP OSTEOARTHRITIS   for 1 years, x-rays show bone-on-bone arthritic changes, and osteophytes. Despite conservative measures with observation, anti-inflammatory medicine, narcotics, use of a cane, has severe unremitting pain and can ambulate only a few blocks before resting. Patient desires elective R total hip arthroplasty to decrease pain and increase function. The risks, benefits, and alternatives were discussed at length including but not limited to the risks of infection, bleeding, nerve injury, stiffness, blood clots, the need for revision surgery, cardiopulmonary complications, among others, and they were willing to proceed. Questions answered     PROCEDURE IN DETAIL: The patient was identified by armband,  received preoperative IV antibiotics [facility], taken to the operating room , appropriate anesthetic monitors  were attached and  anesthesia was induced with the patienton the gurney. The HANA boots were applied to the feet and he was then transferred to the HANA table with a peroneal post and support underneath the non-operative  le, which was locked in 5 lb traction. Theoperative lower extremity was then prepped and draped in the usual sterile fashion from just above the iliac crest to the knee. And a timeout procedure was performed. We then made a 10 cm incision along the interval at the leading edge of the tensor fascia lata of starting at 2 cm lateral to and 2 cm distal to the ASIS. Small bleeders in the skin and subcutaneous tissue identified and cauterized we dissected down to the fascia and made an incision in the fascia allowing us to elevate the fascia of the tensor muscle and exploited the interval between the rectus and the tensor fascia lata. A Hohmann retractor was then placed along the superior neck of the femur and a Cobra retractor along the inferior neck of the femur we teed the capsule starting out at the superior anterior aspect of the acetabulum going distally and made the T along the neck both leaflets of the T were tagged with #2 Ethibond suture. Cobra retractors were then placed along the inferior and superior neck allowing us to perform a standard neck cut and  removed the femoral head with a power corkscrew. We then placed a right angle Hohmann retractor along the anterior aspect of the acetabulum a spiked Cobra in the cotyloid notch and posteriorly a Muelller retractor. We then sequentially reamed up to a 47 mm basket reamer obtaining good coverage in all quadrants, verified by C-arm imaging. Under C-arm control with and hammered into place a 48 mm Pinnacle cup in 45 of abduction and 15 of anteversion. The cup seated nicely and required no supplemental screws. We then placed a central hole Eliminator and a 0 polyethylene liner. The foot was then externally rotated to 110, the HANA elevator was placed around the flare of the greater trochanter and the limb was extended and abducted delivering the proximal femur up into the wound. A medium Hohmann retractor was placed over the greater trochanter and a Mueller  retractor along the posterior femoral neck completing the exposure. We then performed releases superiorly and and inferiorly of the capsule going back to the pirformis fossa superiorly and to the lesser trochanter inferiorly. We then entered the proximal femur with the box cutting offset chisel followed by, a canal sounder, the chili pepper and broaching up to a 2 broach. This seated nicely and we reamed the calcar. A trial reduction was performed with a 1.5 mm 32 mm head.The limb lengths were excellent the hip was stable in 90 of external rotation. At this point the trial components removed and we hammered into place a # 2 Tri-Lock stem with Gryption coating. This was a std offset stem and a + 1.5 32 mm ceramic ball was then hammered into place the hip was reduced and final C-arm images obtained. The wound was thoroughly irrigated with normal saline solution. We repaired the ant capsule and the tensor fascia lot a with running 0 vicryl suture. the subcutaneous tissue was closed with 2-0 and 3-0 Vicryl suture followed by an Aquacil dressing. At this point the patient was awaken and transferred to hospital gurney without difficulty. The subcutaneous tissue with 0 and 2-0 undyed Vicryl suture and the skin with running  3-0 vicryl subcuticular suture. Aquacil dressing was applied. The patient was then unclamped, rolled supine, awaken extubated and taken to recovery room without difficulty in stable condition.   Gean BirchwoodOWAN,Kristan Brummitt J 07/23/2016, 1:29 PM

## 2016-07-23 NOTE — Anesthesia Procedure Notes (Addendum)
Spinal  Patient location during procedure: OR Start time: 07/23/2016 11:50 AM End time: 07/23/2016 11:57 AM Staffing Anesthesiologist: Linton RumpALLAN, Nyasha Rahilly DICKERSON Performed: anesthesiologist  Preanesthetic Checklist Completed: patient identified, site marked, surgical consent, pre-op evaluation, timeout performed, IV checked, risks and benefits discussed and monitors and equipment checked Spinal Block Prep: ChloraPrep Patient monitoring: heart rate, cardiac monitor, continuous pulse ox and blood pressure Approach: midline Location: L3-4 Injection technique: single-shot Needle Needle type: Pencan  Needle gauge: 24 G Needle length: 9 cm Needle insertion depth: 5 cm Additional Notes After 20 minutes, the patient endorsed numbness only from her toes to her knees bilaterally. The decision was made to convert to GA. In the PACU, the patient's spinal is fully set up.

## 2016-07-23 NOTE — Transfer of Care (Signed)
Immediate Anesthesia Transfer of Care Note  Patient: Michaela Lawson  Procedure(s) Performed: Procedure(s): RIGHT TOTAL HIP ARTHROPLASTY ANTERIOR APPROACH (Right)  Patient Location: PACU  Anesthesia Type:General  Level of Consciousness: awake, alert  and oriented  Airway & Oxygen Therapy: Patient Spontanous Breathing and Patient connected to face mask oxygen  Post-op Assessment: Report given to RN, Post -op Vital signs reviewed and stable and Patient moving all extremities  Post vital signs: Reviewed and stable  Last Vitals:  Vitals:   07/23/16 0842  BP: 131/77  Pulse: 87  Resp: 20  Temp: 37 C    Last Pain:  Vitals:   07/23/16 0842  TempSrc: Oral      Patients Stated Pain Goal: 3 (07/23/16 [id number])  Complications: No apparent anesthesia complications

## 2016-07-23 NOTE — Anesthesia Procedure Notes (Signed)
Procedure Name: Intubation Date/Time: 07/23/2016 12:16 PM Performed by: Lovie CholOCK, Keshun Berrett K Pre-anesthesia Checklist: Patient identified, Emergency Drugs available, Suction available and Patient being monitored Patient Re-evaluated:Patient Re-evaluated prior to inductionOxygen Delivery Method: Circle System Utilized Preoxygenation: Pre-oxygenation with 100% oxygen Intubation Type: IV induction Ventilation: Mask ventilation without difficulty Laryngoscope Size: Mac and 3 Grade View: Grade I Tube type: Oral Number of attempts: 1 Airway Equipment and Method: Stylet Placement Confirmation: ETT inserted through vocal cords under direct vision,  positive ETCO2 and breath sounds checked- equal and bilateral Secured at: 21 cm Tube secured with: Tape Dental Injury: Teeth and Oropharynx as per pre-operative assessment  Comments: Intubation performed by Mallie SnooksJennifer Campbell, SRNA

## 2016-07-23 NOTE — Anesthesia Postprocedure Evaluation (Signed)
Anesthesia Post Note  Patient: [name]  Procedure(s) Performed: Procedure(s) (LRB): RIGHT TOTAL HIP ARTHROPLASTY ANTERIOR APPROACH (Right)  Patient location during evaluation: PACU Anesthesia Type: General Level of consciousness: awake and alert Pain management: pain level controlled Vital Signs Assessment: post-procedure vital signs reviewed and stable Respiratory status: spontaneous breathing, nonlabored ventilation, respiratory function stable and patient connected to nasal cannula oxygen Cardiovascular status: blood pressure returned to baseline and stable Postop Assessment: no signs of nausea or vomiting, no backache, no headache and patient able to bend at knees Anesthetic complications: no    Last Vitals:  Vitals:   07/23/16 1605 07/23/16 1620  BP: 122/72 116/78  Pulse: 74 67  Resp: 19 15  Temp:  36.7 C    Last Pain:  Vitals:   07/23/16 1620  TempSrc:   PainSc: 3                  Linton RumpJennifer Dickerson Pacey Altizer

## 2016-07-24 LAB — CBC
HCT: 38.2 % (ref 36.0–46.0)
HEMOGLOBIN: 12.6 g/dL (ref 12.0–15.0)
MCH: 33.5 pg (ref 26.0–34.0)
MCHC: 33 g/dL (ref 30.0–36.0)
MCV: 101.6 fL — ABNORMAL HIGH (ref 78.0–100.0)
PLATELETS: 256 10*3/uL (ref 150–400)
RBC: 3.76 MIL/uL — AB (ref 3.87–5.11)
RDW: 12.6 % (ref 11.5–15.5)
WBC: 12 10*3/uL — AB (ref 4.0–10.5)

## 2016-07-24 MED ORDER — PNEUMOCOCCAL VAC POLYVALENT 25 MCG/0.5ML IJ INJ
0.5000 mL | INJECTION | INTRAMUSCULAR | Status: DC
  Filled 2016-07-24: qty 0.5

## 2016-07-24 NOTE — Progress Notes (Signed)
Physical Therapy Treatment Patient Details Name: Michaela Lawson MRN: [id number] DOB: 05/21/1971 Today's Date: 07/24/2016    History of Present Illness Pt is a 45 y/o female s/p R THA. PMH including but not limited to fibromyalgia.    PT Comments    Pt presented sitting OOB in recliner attempting to return to bed with assistance from significant other. Pt reported increased, significant pain in R LE while sitting up and requesting to return to bed. Pt very limited during second session and was unable to participate in stair training or further gait training at this time. Pt would continue to benefit from skilled physical therapy services at this time while admitted and after d/c to address her limitations in order to improve her overall safety and independence with functional mobility. PT plan to stair train at next session if appropriate.    Follow Up Recommendations  Home health PT;Supervision for mobility/OOB     Equipment Recommendations  Other (comment) (pt reported having all necessary DME at home)    Recommendations for Other Services       Precautions / Restrictions Precautions Precautions: Fall Restrictions Weight Bearing Restrictions: Yes RLE Weight Bearing: Weight bearing as tolerated    Mobility  Bed Mobility Overal bed mobility: Needs Assistance Bed Mobility: Sit to Supine     Supine to sit: Min assist;HOB elevated Sit to supine: Min assist   General bed mobility comments: pt required increased time and min A with R LE movement  Transfers Overall transfer level: Needs assistance Equipment used: Rolling walker (2 wheeled) Transfers: Sit to/from Stand Sit to Stand: Min guard         General transfer comment: pt required increased time and VC'ing for bilateral hand positioning  Ambulation/Gait Ambulation/Gait assistance: Min guard Ambulation Distance (Feet): 10 Feet Assistive device: Rolling walker (2 wheeled) Gait Pattern/deviations: Step-to  pattern;Decreased step length - left;Decreased stance time - right;Decreased weight shift to right Gait velocity: decreased Gait velocity interpretation: Below normal speed for age/gender General Gait Details: pt required VC'ing for sequencing with RW   Stairs            Wheelchair Mobility    Modified Rankin (Stroke Patients Only)       Balance Overall balance assessment: Needs assistance Sitting-balance support: Feet supported;No upper extremity supported Sitting balance-Leahy Scale: Fair     Standing balance support: During functional activity;Bilateral upper extremity supported Standing balance-Leahy Scale: Poor                      Cognition Arousal/Alertness: Awake/alert Behavior During Therapy: WFL for tasks assessed/performed Overall Cognitive Status: Within Functional Limits for tasks assessed                      Exercises Total Joint Exercises Ankle Circles/Pumps: AROM;Strengthening;Both;10 reps;Supine Quad Sets: AROM;Strengthening;Right;5 reps;Supine Hip ABduction/ADduction: AAROM;Right;5 reps;Supine Long Arc Quad: AROM;Strengthening;Right;5 reps;Seated Marching in Standing: AAROM;Strengthening;Right;5 reps;Seated    General Comments        Pertinent Vitals/Pain Pain Assessment: Faces Faces Pain Scale: Hurts whole lot Pain Location: R hip Pain Descriptors / Indicators: Grimacing;Guarding;Operative site guarding;Moaning Pain Intervention(s): Monitored during session;Repositioned    Home Living Family/patient expects to be discharged to:: Private residence Living Arrangements: Spouse/significant other;Children Available Help at Discharge: Family;Available PRN/intermittently Type of Home: Mobile home Home Access: Stairs to enter Entrance Stairs-Rails: Right Home Layout: One level Home Equipment: Cane - single point;Walker - 2 wheels;Bedside commode Additional Comments: pt stated all DME was delivered to  her home yesterday     Prior Function Level of Independence: Independent with assistive device(s)      Comments: prior to admission, pt ambulated with use of cane   PT Goals (current goals can now be found in the care plan section) Acute Rehab PT Goals Patient Stated Goal: return home PT Goal Formulation: With patient Time For Goal Achievement: 07/31/16 Potential to Achieve Goals: Good Progress towards PT goals: Progressing toward goals    Frequency  7X/week    PT Plan Current plan remains appropriate    Co-evaluation             End of Session Equipment Utilized During Treatment: Gait belt Activity Tolerance: Patient limited by pain;Patient limited by fatigue Patient left: in bed;with call bell/phone within reach;with family/visitor present     Time: 1357-1411 PT Time Calculation (min) (ACUTE ONLY): 14 min  Charges:  $Gait Training: 8-22 mins                    G CodesAlessandra Bevels:      Eliceo Gladu M Graelyn Bihl 07/24/2016, 2:14 PM Deborah ChalkJennifer Hilding Quintanar, PT, DPT [phone]

## 2016-07-24 NOTE — Evaluation (Signed)
Occupational Therapy Evaluation and Discharge Patient Details Name: Michaela Lawson MRN: [id number] DOB: 08/13/1971 Today's Date: 07/24/2016    History of Present Illness Pt is a 45 y/o female s/p R THA. PMH including but not limited to fibromyalgia.   Clinical Impression   This 45 yo female admitted and underwent above presents to acute OT with all education completed, we will D/C from acute OT.    Follow Up Recommendations  No OT follow up    Equipment Recommendations  Other (comment) (equipment already ordered and delivered per pt)       Precautions / Restrictions Precautions Precautions: Fall Restrictions Weight Bearing Restrictions: No RLE Weight Bearing: Weight bearing as tolerated      Mobility Bed Mobility Overal bed mobility: Needs Assistance Bed Mobility: Sit to Supine       Sit to supine: Min assist   General bed mobility comments: pt up on side of bed when I entered room  Transfers Overall transfer level: Needs assistance Equipment used: Rolling walker (2 wheeled) Transfers: Sit to/from Stand Sit to Stand: Min guard         General transfer comment: pt ambulated 10 feet then began to feel nauseated, dizzy, and hot-- we returned to her room and I gave her a cold washcloth which she said did help a little    Balance Overall balance assessment: Needs assistance Sitting-balance support: Feet supported;No upper extremity supported Sitting balance-Leahy Scale: Fair     Standing balance support: Bilateral upper extremity supported;During functional activity Standing balance-Leahy Scale: Poor Standing balance comment: reliant on RW                            ADL Overall ADL's : Needs assistance/impaired Eating/Feeding: Independent;Sitting   Grooming: Set up;Sitting   Upper Body Bathing: Set up;Sitting     Lower Body Bathing Details (indicate cue type and reason): husband will A prn Upper Body Dressing : Set up;Sitting     Lower Body  Dressing Details (indicate cue type and reason): husband will A prn and he as well as pt were educatedd on the most efficient sequence of getting dressed     Toileting- Clothing Manipulation and Hygiene: Min guard;Sit to/from stand         General ADL Comments: Pt reports that she plans with her husbands A to sit on the side of her garden tub, spin around, stand up and sit on her 3n1 in tub               Pertinent Vitals/Pain Pain Assessment: Faces Pain Score: 7  Faces Pain Scale: Hurts whole lot Pain Location: right thigh Pain Descriptors / Indicators: Aching;Sore;Grimacing Pain Intervention(s): Monitored during session;Repositioned     Hand Dominance Right   Extremity/Trunk Assessment Upper Extremity Assessment Upper Extremity Assessment: Overall WFL for tasks assessed           Communication Communication Communication: No difficulties   Cognition Arousal/Alertness: Awake/alert Behavior During Therapy: WFL for tasks assessed/performed Overall Cognitive Status: Within Functional Limits for tasks assessed                               Home Living Family/patient expects to be discharged to:: Private residence Living Arrangements: Spouse/significant other;Children Available Help at Discharge: Family;Available PRN/intermittently Type of Home: Mobile home Home Access: Stairs to enter Entrance Stairs-Number of Steps: 2 Entrance Stairs-Rails: Right Home Layout: One level  Bathroom Shower/Tub:  (garden tub with one step up to get into it)   FirefighterBathroom Toilet: Standard     Home Equipment: Gilmer MorCane - single point;Walker - 2 wheels;Bedside commode;Hand held shower head   Additional Comments: pt stated all DME was delivered to her home yesterday      Prior Functioning/Environment Level of Independence: Independent with assistive device(s)        Comments: prior to admission, pt ambulated with use of cane    OT Diagnosis: Generalized weakness          OT Goals(Current goals can be found in the care plan section) Acute Rehab OT Goals Patient Stated Goal: return home  OT Frequency:                End of Session Equipment Utilized During Treatment: Gait belt;Rolling walker  Activity Tolerance:  (limited by nausea, dizziness and feeling really hot) Patient left: in chair;with call bell/phone within reach;with family/visitor present   Time: [id number] OT Time Calculation (min): 25 min Charges:  OT General Charges $OT Visit: 1 Procedure OT Evaluation $OT Eval Moderate Complexity: 1 Procedure OT Treatments $Self Care/Home Management : 8-22 mins  Evette GeorgesLeonard, Ramanda Paules Eva [phone] 07/24/2016, 3:32 PM

## 2016-07-24 NOTE — Care Management Note (Signed)
Case Management Note  Patient Details  Name: Elverna Decaire MRN: [id number] Date of Birth: 04/29/1971  Subjective/Objective: 45 yo F s/p R THA                Action/Plan: received referral to assist with HH needs   Expected Discharge Date:   07/24/16               Expected Discharge Plan:  Home w Home Health Services  In-House Referral:     Discharge planning Services  CM Consult  Post Acute Care Choice:    Choice offered to:     DME Arranged:    DME Agency:     HH Arranged:   PT HH Agency:  Other - See comment  Status of Service:  In process, will continue to follow  If discussed at Long Length of Stay Meetings, dates discussed:    Additional Comments: met with pt and husband at bedside. D/C plan is to return home with the support of her husband. She has a cane, 3-in-BSC, and a RW. PT is recommending HHPT. Pt has worker's comp. Informed pt that worker's comp will arrange the HHPT. Asked pt for the CM name and phone #. She stated that Karen is the case worker and she told her that we need to call [facility] to arrange HHPT. Contacted [facility] at [phone] and spoke to receptionist about HHPT for member. She stated that she will provide member's information to the on call nurse. Provided member's information and CM.    Oliveras-Aizpurua, Jeannette, RN 07/24/2016, 1:31 PM  

## 2016-07-24 NOTE — Discharge Summary (Signed)
Patient ID: Michaela Lawson MRN: [id number] DOB/AGE: 45/12/1970 45 y.o.  Admit date: 07/23/2016 Discharge date: 07/24/2016  Admission Diagnoses:  Principal Problem:   Primary localized osteoarthritis of right hip Active Problems:   Arthritis of right hip   Discharge Diagnoses:  Same  Past Medical History:  Diagnosis Date  . Arthritis   . Fibromyalgia   . History of pneumonia   . Panic attacks   . PONV (postoperative nausea and vomiting)   . Urinary frequency   . Wears glasses     Surgeries: Procedure(s): RIGHT TOTAL HIP ARTHROPLASTY ANTERIOR APPROACH on 07/23/2016   Discharged Condition: Improved  Hospital Course: Michaela Dancengie Andis is an 45 y.o. female who was admitted 07/23/2016 for operative treatment ofPrimary localized osteoarthritis of right hip. Patient has severe unremitting pain that affects sleep, daily activities, and work/hobbies. After pre-op clearance the patient was taken to the operating room on 07/23/2016 and underwent  Procedure(s): RIGHT TOTAL HIP ARTHROPLASTY ANTERIOR APPROACH.    Patient was given perioperative antibiotics: Anti-infectives    Start     Dose/Rate Route Frequency Ordered Stop   07/23/16 0823  ceFAZolin (ANCEF) 2-4 GM/100ML-% IVPB    Comments:  Lorenda IshiharaGibbs, Bonnie   : cabinet override      07/23/16 0823 07/23/16 2029   07/23/16 0821  ceFAZolin (ANCEF) IVPB 2g/100 mL premix     2 g 200 mL/hr over 30 Minutes Intravenous On call to O.R. 07/23/16 [id number] 07/23/16 1216       Patient was given sequential compression devices, early ambulation, and chemoprophylaxis to prevent DVT.  Patient benefited maximally from hospital stay and there were no complications.    Recent vital signs: Patient Vitals for the past 24 hrs:  BP Temp Temp src Pulse Resp SpO2  07/24/16 0546 99/74 97.7 F (36.5 C) Oral 77 16 99 %  07/24/16 0003 130/76 98.2 F (36.8 C) Oral 72 16 99 %  07/23/16 2021 123/74 98.4 F (36.9 C) Oral 83 16 100 %  07/23/16 1802 - - - 81 - 98 %  07/23/16 1644  122/82 98.1 F (36.7 C) Oral 87 16 96 %  07/23/16 1620 116/78 98 F (36.7 C) - 67 15 99 %  07/23/16 1605 122/72 - - 74 19 100 %  07/23/16 1505 116/77 - - 73 14 99 %  07/23/16 1450 110/70 - - 76 17 99 %  07/23/16 1435 110/73 - - 73 16 97 %  07/23/16 1420 116/76 - - 84 15 98 %  07/23/16 1405 117/77 - - 84 16 100 %  07/23/16 1350 118/77 97.7 F (36.5 C) - 91 15 100 %     Recent laboratory studies:  Recent Labs  07/24/16 0423  WBC 12.0*  HGB 12.6  HCT 38.2  PLT 256     Discharge Medications:     Medication List    STOP taking these medications   cyclobenzaprine 10 MG tablet Commonly known as:  FLEXERIL   meloxicam 15 MG tablet Commonly known as:  MOBIC   OVER THE COUNTER MEDICATION     TAKE these medications   aspirin EC 325 MG tablet Take 1 tablet (325 mg total) by mouth 2 (two) times daily.   methocarbamol 500 MG tablet Commonly known as:  ROBAXIN Take 1 tablet (500 mg total) by mouth 2 (two) times daily with a meal.   oxyCODONE-acetaminophen 5-325 MG tablet Commonly known as:  ROXICET Take 1 tablet by mouth every 4 (four) hours as needed.  Diagnostic Studies: Dg Chest 2 View  Result Date: 07/13/2016 CLINICAL DATA:  45 year old female with a history of preoperative chest x-ray EXAM: CHEST  2 VIEW COMPARISON:  None. FINDINGS: The heart size and mediastinal contours are within normal limits. Both lungs are clear. The visualized skeletal structures are unremarkable. IMPRESSION: No active cardiopulmonary disease. Signed, Yvone NeuJaime S. Loreta AveWagner, DO [facility] Electronically Signed   By: Gilmer MorJaime  Wagner D.O.   On: 07/13/2016 14:14   Dg C-arm 1-60 Min  Result Date: 07/23/2016 CLINICAL DATA:  Right hip replacement EXAM: OPERATIVE RIGHT HIP WITH PELVIS; DG C-ARM 61-120 MIN COMPARISON:  None. FLUOROSCOPY TIME:  Radiation Exposure Index (as provided by the fluoroscopic device): Not available If the device does not  provide the exposure index: Fluoroscopy Time:  8 seconds Number of Acquired Images:  2 FINDINGS: Right hip replacement is noted. No acute bony or soft tissue abnormality is seen. IMPRESSION: Status post right hip replacement. Electronically Signed   By: Alcide CleverMark  Lukens M.D.   On: 07/23/2016 13:27   Dg Hip Operative Unilat W Or W/o Pelvis Right  Result Date: 07/23/2016 CLINICAL DATA:  Right hip replacement EXAM: OPERATIVE RIGHT HIP WITH PELVIS; DG C-ARM 61-120 MIN COMPARISON:  None. FLUOROSCOPY TIME:  Radiation Exposure Index (as provided by the fluoroscopic device): Not available If the device does not provide the exposure index: Fluoroscopy Time:  8 seconds Number of Acquired Images:  2 FINDINGS: Right hip replacement is noted. No acute bony or soft tissue abnormality is seen. IMPRESSION: Status post right hip replacement. Electronically Signed   By: Alcide CleverMark  Lukens M.D.   On: 07/23/2016 13:27    Disposition: Final discharge disposition not confirmed  Discharge Instructions    Call MD / Call 911    Complete by:  As directed   If you experience chest pain or shortness of breath, CALL 911 and be transported to the hospital emergency room.  If you develope a fever above 101 F, pus (white drainage) or increased drainage or redness at the wound, or calf pain, call your surgeon's office.   Constipation Prevention    Complete by:  As directed   Drink plenty of fluids.  Prune juice may be helpful.  You may use a stool softener, such as Colace (over the counter) 100 mg twice a day.  Use MiraLax (over the counter) for constipation as needed.   Diet general    Complete by:  As directed   Do not sit on low chairs, stoools or toilet seats, as it may be difficult to get up from low surfaces    Complete by:  As directed   Follow the hip precautions as taught in Physical Therapy    Complete by:  As directed   Increase activity slowly as tolerated    Complete by:  As directed   Weight bearing as tolerated    Complete  by:  As directed   Laterality:  right   Extremity:  Lower      Follow-up Information    Nestor LewandowskyOWAN,FRANK J, MD Follow up in 2 week(s).   Specialty:  Orthopedic Surgery Contact information: [address] [place] [address] [phone]            Signed: Matthew FolksBETHUNE,Blessen Kimbrough G 07/24/2016, 12:58 PM

## 2016-07-24 NOTE — Progress Notes (Signed)
Awaiting for [facility] call. Contacted provider again and left a second message with Patty regarding need for HHPT. She stated that she will provide the information to the on call nurse. Met with pt again. Informed her that I'm still waiting for [facility] to return my call and I asked her for her CM phone number. She provided Karen's phone # [phone]. Will contact Santiago Glad if [facility] doesn't return my calls. Pt may be d/c tomorrow.

## 2016-07-24 NOTE — Evaluation (Signed)
Physical Therapy Evaluation Patient Details Name: Michaela Lawson MRN: [id number] DOB: 10/22/1971 Today's Date: 07/24/2016   History of Present Illness  Pt is a 45 y/o female s/p R THA. PMH including but not limited to fibromyalgia.  Clinical Impression  Pt presented supine in bed with HOB elevated, awake and willing to participate in therapy session. Prior to admission, pt used a SPC to ambulate and was independent with all ADLs. Pt able to perform transfers and ambulate with RW with min guard for safety, no physical assistance needed. Pt would continue to benefit from skilled physical therapy services at this time while admitted and after d/c to address her below listed limitations in order to improve her overall safety and independence with functional mobility. PT will plan to stair train at next session.      Follow Up Recommendations Home health PT;Supervision for mobility/OOB    Equipment Recommendations  Other (comment) (pt reported having all necessary DME at home)    Recommendations for Other Services       Precautions / Restrictions Precautions Precautions: Fall Restrictions Weight Bearing Restrictions: Yes RLE Weight Bearing: Weight bearing as tolerated      Mobility  Bed Mobility Overal bed mobility: Needs Assistance Bed Mobility: Supine to Sit     Supine to sit: Min assist;HOB elevated     General bed mobility comments: pt required increased time and min A with R LE movement  Transfers Overall transfer level: Needs assistance Equipment used: Rolling walker (2 wheeled) Transfers: Sit to/from Stand Sit to Stand: Min guard         General transfer comment: pt required increased time and VC'ing for bilateral hand positioning  Ambulation/Gait Ambulation/Gait assistance: Min guard Ambulation Distance (Feet): 15 Feet Assistive device: Rolling walker (2 wheeled) Gait Pattern/deviations: Step-to pattern;Decreased step length - left;Decreased stance time -  right;Decreased weight shift to right Gait velocity: decreased Gait velocity interpretation: Below normal speed for age/gender General Gait Details: pt required VC'ing for sequencing with RW  Stairs            Wheelchair Mobility    Modified Rankin (Stroke Patients Only)       Balance Overall balance assessment: Needs assistance Sitting-balance support: Feet supported;Bilateral upper extremity supported Sitting balance-Leahy Scale: Poor     Standing balance support: During functional activity;Bilateral upper extremity supported Standing balance-Leahy Scale: Poor                               Pertinent Vitals/Pain Pain Assessment: Faces Faces Pain Scale: Hurts even more Pain Location: R hip Pain Descriptors / Indicators: Grimacing;Guarding;Operative site guarding;Moaning Pain Intervention(s): Monitored during session;Repositioned    Home Living Family/patient expects to be discharged to:: Private residence Living Arrangements: Spouse/significant other;Children Available Help at Discharge: Family;Available PRN/intermittently Type of Home: Mobile home Home Access: Stairs to enter Entrance Stairs-Rails: Right Entrance Stairs-Number of Steps: 2 Home Layout: One level Home Equipment: Cane - single point;Walker - 2 wheels;Bedside commode Additional Comments: pt stated all DME was delivered to her home yesterday    Prior Function Level of Independence: Independent with assistive device(s)         Comments: prior to admission, pt ambulated with use of cane     Hand Dominance        Extremity/Trunk Assessment   Upper Extremity Assessment: Overall WFL for tasks assessed           Lower Extremity Assessment: RLE deficits/detail RLE  Deficits / Details: Pt with decreased strength and ROM limitations secondary to post-op.    Cervical / Trunk Assessment: Normal  Communication   Communication: No difficulties  Cognition Arousal/Alertness:  Awake/alert Behavior During Therapy: WFL for tasks assessed/performed Overall Cognitive Status: Within Functional Limits for tasks assessed                      General Comments      Exercises Total Joint Exercises Ankle Circles/Pumps: AROM;Strengthening;Both;10 reps;Seated Long Arc Quad: AROM;Strengthening;Right;5 reps;Seated Marching in Standing: AAROM;Strengthening;Right;5 reps;Seated      Assessment/Plan    PT Assessment Patient needs continued PT services  PT Diagnosis Difficulty walking   PT Problem List Decreased strength;Decreased range of motion;Decreased activity tolerance;Decreased balance;Decreased coordination;Decreased mobility;Decreased knowledge of use of DME;Pain  PT Treatment Interventions DME instruction;Gait training;Stair training;Functional mobility training;Therapeutic activities;Therapeutic exercise;Balance training;Neuromuscular re-education;Patient/family education   PT Goals (Current goals can be found in the Care Plan section) Acute Rehab PT Goals Patient Stated Goal: return home PT Goal Formulation: With patient Time For Goal Achievement: 07/31/16 Potential to Achieve Goals: Good    Frequency 7X/week   Barriers to discharge        Co-evaluation               End of Session Equipment Utilized During Treatment: Gait belt Activity Tolerance: Patient limited by fatigue;Patient limited by pain Patient left: in chair;with call bell/phone within reach;with family/visitor present Nurse Communication: Mobility status;Patient requests pain meds         Time: 9562-13081015-1038 PT Time Calculation (min) (ACUTE ONLY): 23 min   Charges:   PT Evaluation $PT Eval Moderate Complexity: 1 Procedure     PT G CodesAlessandra Bevels:        Czar Ysaguirre M Rajanae Mantia 07/24/2016, 11:35 AM Deborah ChalkJennifer Hanan Mcwilliams, PT, DPT [phone]

## 2016-07-24 NOTE — Progress Notes (Signed)
Subjective: 1 Day Post-Op Procedure(s) (LRB): RIGHT TOTAL HIP ARTHROPLASTY ANTERIOR APPROACH (Right) Patient reports pain as moderate.  Taking by mouth and voiding okay.  Objective: Vital signs in last 24 hours: Temp:  [97.7 F (36.5 C)-98.4 F (36.9 C)] 97.7 F (36.5 C) (08/19 0546) Pulse Rate:  [67-91] 77 (08/19 0546) Resp:  [14-19] 16 (08/19 0546) BP: (99-130)/(70-82) 99/74 (08/19 0546) SpO2:  [96 %-100 %] 99 % (08/19 0546)  Intake/Output from previous day: 08/18 0701 - 08/19 0700 In: 2450 [P.O.:600; I.V.:1850] Out: 300 [Urine:100; Blood:200] Intake/Output this shift: No intake/output data recorded.   Recent Labs  07/24/16 0423  HGB 12.6    Recent Labs  07/24/16 0423  WBC 12.0*  RBC 3.76*  HCT 38.2  PLT 256   No results for input(s): NA, K, CL, CO2, BUN, CREATININE, GLUCOSE, CALCIUM in the last 72 hours. No results for input(s): LABPT, INR in the last 72 hours. Right hip exam: Neurovascular intact Sensation intact distally Intact pulses distally Dorsiflexion/Plantar flexion intact Incision: dressing C/D/I Compartment soft  Assessment/Plan: 1 Day Post-Op Procedure(s) (LRB): RIGHT TOTAL HIP ARTHROPLASTY ANTERIOR APPROACH (Right)  Plan: Up with therapy Discharge home with home health If passes physical therapy. Aspirin 325 mg twice daily 2 weeks for DVT prophylaxis.  Follow-up with Dr. Turner Danielsowan in 2 weeks Cristianna Cyr G 07/24/2016, 11:39 AM

## 2016-07-24 NOTE — Progress Notes (Signed)
[facility] did not call back. Contacted Clydie BraunKaren, worker's comp CM and left a VM regarding calls to [facility] and [facility]. Informed her that pt may be d/c tomorrow. Provided Maralyn SagoSarah, CM phone #.

## 2016-07-25 LAB — CBC
HCT: 38.3 % (ref 36.0–46.0)
HEMOGLOBIN: 12.5 g/dL (ref 12.0–15.0)
MCH: 33.6 pg (ref 26.0–34.0)
MCHC: 32.6 g/dL (ref 30.0–36.0)
MCV: 103 fL — ABNORMAL HIGH (ref 78.0–100.0)
PLATELETS: 247 10*3/uL (ref 150–400)
RBC: 3.72 MIL/uL — AB (ref 3.87–5.11)
RDW: 12.7 % (ref 11.5–15.5)
WBC: 11.7 10*3/uL — AB (ref 4.0–10.5)

## 2016-07-25 NOTE — Progress Notes (Signed)
Physical Therapy Treatment Patient Details Name: Michaela Lawson MRN: [id number] DOB: 09/06/1971 Today's Date: 07/25/2016    History of Present Illness Pt is a 45 y/o female s/p R THA. PMH including but not limited to fibromyalgia.    PT Comments    Patient progressing well towards PT goals. Improved ambulation distance today with cues for sequencing and safety. Pt's spouse assisting a lot with mobility/transfers. Performed family training on stairs this AM with spouse present. Reviewed exercises. Will focus on gait training and bed mobility in PM session to maximize independence.   Follow Up Recommendations  Home health PT;Supervision for mobility/OOB     Equipment Recommendations  None recommended by PT    Recommendations for Other Services       Precautions / Restrictions Precautions Precautions: Fall Restrictions Weight Bearing Restrictions: Yes RLE Weight Bearing: Weight bearing as tolerated    Mobility  Bed Mobility Overal bed mobility: Needs Assistance Bed Mobility: Supine to Sit     Supine to sit: Min assist;HOB elevated;Mod assist     General bed mobility comments: Pt wrapping arms around spouse to get to EOB and standing despite cues to try without assist.   Transfers Overall transfer level: Needs assistance Equipment used: Rolling walker (2 wheeled) Transfers: Sit to/from Stand Sit to Stand: Min guard         General transfer comment: Able to stand from EOB x1, from toilet x1, from chair x1 with good demo of hand placement.   Ambulation/Gait Ambulation/Gait assistance: Min guard Ambulation Distance (Feet): 60 Feet (x2 bouts) Assistive device: Rolling walker (2 wheeled) Gait Pattern/deviations: Step-to pattern;Step-through pattern;Decreased weight shift to right;Decreased stance time - right;Decreased step length - left Gait velocity: decreased Gait velocity interpretation: Below normal speed for age/gender General Gait Details: Cues for step through gait  and knee flexion to advance RLE as pt cirdumducting. Cues for RW sequencing. 1 seated rest break. Fatigues.    Stairs Stairs: Yes Stairs assistance: Min assist Stair Management: Backwards;With walker;Step to pattern Number of Stairs: 2 General stair comments: Cues for technique and safety. Spouse stabilizing RW.  Wheelchair Mobility    Modified Rankin (Stroke Patients Only)       Balance Overall balance assessment: Needs assistance Sitting-balance support: Feet supported;No upper extremity supported Sitting balance-Leahy Scale: Good     Standing balance support: During functional activity Standing balance-Leahy Scale: Poor Standing balance comment: Reliant on RW for support.                    Cognition Arousal/Alertness: Awake/alert Behavior During Therapy: WFL for tasks assessed/performed Overall Cognitive Status: Within Functional Limits for tasks assessed                      Exercises Total Joint Exercises Ankle Circles/Pumps: Both;10 reps;Seated Quad Sets: Both;10 reps;Seated Hip ABduction/ADduction: AAROM;Right;10 reps    General Comments General comments (skin integrity, edema, etc.): Spouse present during session.      Pertinent Vitals/Pain Pain Assessment: 0-10 Pain Score: 6  Pain Location: right thigh Pain Descriptors / Indicators: Burning;Guarding Pain Intervention(s): Monitored during session;Repositioned    Home Living                      Prior Function            PT Goals (current goals can now be found in the care plan section) Progress towards PT goals: Progressing toward goals    Frequency  7X/week  PT Plan Current plan remains appropriate    Co-evaluation             End of Session Equipment Utilized During Treatment: Gait belt Activity Tolerance: Patient tolerated treatment well;Patient limited by pain Patient left: in chair;with call bell/phone within reach;with family/visitor present      Time: 0737-0803 PT Time Calculation (min) (ACUTE ONLY): 26 min  Charges:  $Gait Training: 23-37 mins                    G Codes:      Kadejah Sandiford A Aracelli Woloszyn 07/25/2016, 8:18 AM  Mylo RedShauna Georgana Romain, PT, DPT [phone]

## 2016-07-25 NOTE — Progress Notes (Signed)
Subjective: 2 Days Post-Op Procedure(s) (LRB): RIGHT TOTAL HIP ARTHROPLASTY ANTERIOR APPROACH (Right) Patient reports pain as mild.  Taking by mouth and voiding okay. Was not able to ambulate safely yesterday with physical therapy and was kept until today. This morning did much better with physical therapy.  Objective: Vital signs in last 24 hours: Temp:  [97.5 F (36.4 C)-98.4 F (36.9 C)] 97.5 F (36.4 C) (08/20 0545) Pulse Rate:  [77-84] 77 (08/20 0545) BP: (108-119)/(69-73) 113/72 (08/20 0545) SpO2:  [100 %] 100 % (08/20 0545)  Intake/Output from previous day: 08/19 0701 - 08/20 0700 In: 240 [P.O.:240] Out: 3 [Urine:2; Stool:1] Intake/Output this shift: No intake/output data recorded.   Recent Labs  07/24/16 0423 07/25/16 0248  HGB 12.6 12.5    Recent Labs  07/24/16 0423 07/25/16 0248  WBC 12.0* 11.7*  RBC 3.76* 3.72*  HCT 38.2 38.3  PLT 256 247   No results for input(s): NA, K, CL, CO2, BUN, CREATININE, GLUCOSE, CALCIUM in the last 72 hours. No results for input(s): LABPT, INR in the last 72 hours. Right hip exam: Neurovascular intact Sensation intact distally Intact pulses distally Dorsiflexion/Plantar flexion intact Incision: dressing C/D/I Compartment soft  Assessment/Plan: 2 Days Post-Op Procedure(s) (LRB): RIGHT TOTAL HIP ARTHROPLASTY ANTERIOR APPROACH (Right) Plan: Weightbearing as tolerated on right without hip precautions. Aspirin 325 mg 2 weeks postop for DVT prophylaxis. Up with therapy Discharge home with home health Follow-up with Dr. Turner Danielsowan in 2 weeks.  Tu Shimmel G 07/25/2016, 10:28 AM

## 2016-07-25 NOTE — Plan of Care (Signed)
Problem: Pain Managment: Goal: General experience of comfort will improve Outcome: Progressing Medicated twice for pain this shift, resting in the bed with eyes closed at present time  Problem: Physical Regulation: Goal: Will remain free from infection Outcome: Progressing No signs of infection noted. VS WNL  Problem: Tissue Perfusion: Goal: Risk factors for ineffective tissue perfusion will decrease Outcome: Progressing No signs of dvt noted  Problem: Nutrition: Goal: Adequate nutrition will be maintained Outcome: Progressing No nutrition issues noted  Problem: Bowel/Gastric: Goal: Will not experience complications related to bowel motility Outcome: Progressing No gastric or bowel issues noted, patient reported having a BM tonight

## 2016-07-25 NOTE — Discharge Summary (Signed)
Patient ID: Michaela Lawson MRN: [id number] DOB/AGE: 45/12/1970 45 y.o.  Admit date: 07/23/2016 Discharge date: 07/25/2016  Admission Diagnoses:  Principal Problem:   Primary localized osteoarthritis of right hip Active Problems:   Arthritis of right hip   Discharge Diagnoses:  Same  Past Medical History:  Diagnosis Date  . Arthritis   . Fibromyalgia   . History of pneumonia   . Panic attacks   . PONV (postoperative nausea and vomiting)   . Urinary frequency   . Wears glasses     Surgeries: Procedure(s): RIGHT TOTAL HIP ARTHROPLASTY ANTERIOR APPROACH on 07/23/2016   Consultants:   Discharged Condition: Improved  Hospital Course: Michaela Lawson is an 45 y.o. female who was admitted 07/23/2016 for operative treatment ofPrimary localized osteoarthritis of right hip. Patient has severe unremitting pain that affects sleep, daily activities, and work/hobbies. After pre-op clearance the patient was taken to the operating room on 07/23/2016 and underwent  Procedure(s): RIGHT TOTAL HIP ARTHROPLASTY ANTERIOR APPROACH.    Patient was given perioperative antibiotics: Anti-infectives    Start     Dose/Rate Route Frequency Ordered Stop   07/23/16 0823  ceFAZolin (ANCEF) 2-4 GM/100ML-% IVPB    Comments:  Lorenda IshiharaGibbs, Bonnie   : cabinet override      07/23/16 0823 07/23/16 2029   07/23/16 0821  ceFAZolin (ANCEF) IVPB 2g/100 mL premix     2 g 200 mL/hr over 30 Minutes Intravenous On call to O.R. 07/23/16 [id number] 07/23/16 1216       Patient was given sequential compression devices, early ambulation, and chemoprophylaxis to prevent DVT.The patient was unable to ambulate safely on postop day #1 and was felt that she needed an additional day of physical therapy for pain management and safe ambulation.  Patient benefited maximally from hospital stay and there were no complications.    Recent vital signs: Patient Vitals for the past 24 hrs:  BP Temp Temp src Pulse SpO2  07/25/16 0545 113/72 97.5 F (36.4 C)  Oral 77 100 %  07/24/16 1939 119/73 98.1 F (36.7 C) Oral 80 100 %  07/24/16 1428 108/69 98.4 F (36.9 C) Oral 84 100 %     Recent laboratory studies:  Recent Labs  07/24/16 0423 07/25/16 0248  WBC 12.0* 11.7*  HGB 12.6 12.5  HCT 38.2 38.3  PLT 256 247     Discharge Medications:     Medication List    STOP taking these medications   cyclobenzaprine 10 MG tablet Commonly known as:  FLEXERIL   meloxicam 15 MG tablet Commonly known as:  MOBIC   OVER THE COUNTER MEDICATION     TAKE these medications   aspirin EC 325 MG tablet Take 1 tablet (325 mg total) by mouth 2 (two) times daily.   methocarbamol 500 MG tablet Commonly known as:  ROBAXIN Take 1 tablet (500 mg total) by mouth 2 (two) times daily with a meal.   oxyCODONE-acetaminophen 5-325 MG tablet Commonly known as:  ROXICET Take 1 tablet by mouth every 4 (four) hours as needed.       Diagnostic Studies: Dg Chest 2 View  Result Date: 07/13/2016 CLINICAL DATA:  [age] year old female with a history of preoperative chest x-ray EXAM: CHEST  2 VIEW COMPARISON:  None. FINDINGS: The heart size and mediastinal contours are within normal limits. Both lungs are clear. The visualized skeletal structures are unremarkable. IMPRESSION: No active cardiopulmonary disease. Signed, Yvone NeuJaime S. Loreta AveWagner, DO [facility] Electronically Signed   By: Marijean NiemannJaime  Loreta Ave D.O.   On: 07/13/2016 14:14   Dg C-arm 1-60 Min  Result Date: 07/23/2016 CLINICAL DATA:  Right hip replacement EXAM: OPERATIVE RIGHT HIP WITH PELVIS; DG C-ARM 61-120 MIN COMPARISON:  None. FLUOROSCOPY TIME:  Radiation Exposure Index (as provided by the fluoroscopic device): Not available If the device does not provide the exposure index: Fluoroscopy Time:  8 seconds Number of Acquired Images:  2 FINDINGS: Right hip replacement is noted. No acute bony or soft tissue abnormality is seen. IMPRESSION: Status post right hip  replacement. Electronically Signed   By: Alcide Clever M.D.   On: 07/23/2016 13:27   Dg Hip Operative Unilat W Or W/o Pelvis Right  Result Date: 07/23/2016 CLINICAL DATA:  Right hip replacement EXAM: OPERATIVE RIGHT HIP WITH PELVIS; DG C-ARM 61-120 MIN COMPARISON:  None. FLUOROSCOPY TIME:  Radiation Exposure Index (as provided by the fluoroscopic device): Not available If the device does not provide the exposure index: Fluoroscopy Time:  8 seconds Number of Acquired Images:  2 FINDINGS: Right hip replacement is noted. No acute bony or soft tissue abnormality is seen. IMPRESSION: Status post right hip replacement. Electronically Signed   By: Alcide Clever M.D.   On: 07/23/2016 13:27    Disposition: Home with home health physical therapy  Discharge Instructions    Call MD / Call 911    Complete by:  As directed   If you experience chest pain or shortness of breath, CALL 911 and be transported to the hospital emergency room.  If you develope a fever above 101 F, pus (white drainage) or increased drainage or redness at the wound, or calf pain, call your surgeon's office.   Call MD / Call 911    Complete by:  As directed   If you experience chest pain or shortness of breath, CALL 911 and be transported to the hospital emergency room.  If you develope a fever above 101 F, pus (white drainage) or increased drainage or redness at the wound, or calf pain, call your surgeon's office.   Constipation Prevention    Complete by:  As directed   Drink plenty of fluids.  Prune juice may be helpful.  You may use a stool softener, such as Colace (over the counter) 100 mg twice a day.  Use MiraLax (over the counter) for constipation as needed.   Constipation Prevention    Complete by:  As directed   Drink plenty of fluids.  Prune juice may be helpful.  You may use a stool softener, such as Colace (over the counter) 100 mg twice a day.  Use MiraLax (over the counter) for constipation as needed.   Diet general    Complete  by:  As directed   Diet general    Complete by:  As directed   Do not sit on low chairs, stoools or toilet seats, as it may be difficult to get up from low surfaces    Complete by:  As directed   Do not sit on low chairs, stoools or toilet seats, as it may be difficult to get up from low surfaces    Complete by:  As directed   Follow the hip precautions as taught in Physical Therapy    Complete by:  As directed   Follow the hip precautions as taught in Physical Therapy    Complete by:  As directed   Increase activity slowly as tolerated    Complete by:  As directed   Increase activity slowly as tolerated  Complete by:  As directed   Weight bearing as tolerated    Complete by:  As directed   Laterality:  right   Extremity:  Lower   Weight bearing as tolerated    Complete by:  As directed   Laterality:  right   Extremity:  Lower      Follow-up Information    Nestor LewandowskyOWAN,FRANK J, MD Follow up in 2 week(s).   Specialty:  Orthopedic Surgery Contact information: [address] [place] [address] [phone]            Signed: Matthew FolksBETHUNE,Lorilyn Laitinen G 07/25/2016, 10:31 AM

## 2016-07-25 NOTE — Progress Notes (Signed)
Physical Therapy Treatment Patient Details Name: Michaela Lawson MFanny DanceRN: [id number] DOB: 05/25/1971 Today's Date: 07/25/2016    History of Present Illness Pt is a 45 y/o female s/p R THA. PMH including but not limited to fibromyalgia.    PT Comments    Patient progressing well with mobility. Better able to perform step through gait pattern this session with assist and cues. Focused on bed mobility this session and instructed pt on how to use sheet to bring RLE into/out of bed. Discussed car transfer techinque and to stop half way home and get out of the car to decrease stiffness/tightness/pain. Pt agreeable. Pt plans to discharge home this afternoon. Will follow if still in hospital tomorrow.   Follow Up Recommendations  Home health PT;Supervision for mobility/OOB     Equipment Recommendations  None recommended by PT    Recommendations for Other Services       Precautions / Restrictions Precautions Precautions: Fall Restrictions Weight Bearing Restrictions: Yes RLE Weight Bearing: Weight bearing as tolerated    Mobility  Bed Mobility Overal bed mobility: Needs Assistance Bed Mobility: Supine to Sit;Sit to Supine     Supine to sit: Min guard Sit to supine: Min assist   General bed mobility comments: Instructed pt to use sheet to bring RLE into/out of bed. Assist needed to bring RLE into bed despite sheet. HOB flat, raised and no rails to simulate home.  Transfers Overall transfer level: Needs assistance Equipment used: Rolling walker (2 wheeled) Transfers: Sit to/from Stand Sit to Stand: Supervision         General transfer comment: Supervision for safety. Reminders for hand placement but no assist needed. Pt leaning towards left to offload right hip as habit- cues for equal distribution of weight.  Ambulation/Gait Ambulation/Gait assistance: Supervision Ambulation Distance (Feet): 150 Feet Assistive device: Rolling walker (2 wheeled) Gait Pattern/deviations: Step-to  pattern;Step-through pattern;Decreased weight shift to right;Decreased stance time - right;Trunk flexed Gait velocity: decreased Gait velocity interpretation: Below normal speed for age/gender General Gait Details: Requires forward momentum of RW to simulate step through gait with cues for more WB through RLE and for knee flexion during swing.    Stairs Stairs: Yes Stairs assistance: Min assist Stair Management: Backwards;With walker;Step to pattern Number of Stairs: 2 General stair comments: Cues for technique and safety. Spouse stabilizing RW.  Wheelchair Mobility    Modified Rankin (Stroke Patients Only)       Balance Overall balance assessment: Needs assistance Sitting-balance support: Feet supported;No upper extremity supported Sitting balance-Leahy Scale: Good Sitting balance - Comments: Able to donn sock on LLE. Assist with RLE.   Standing balance support: During functional activity Standing balance-Leahy Scale: Fair Standing balance comment: Reliant on RW for support.                    Cognition Arousal/Alertness: Awake/alert Behavior During Therapy: WFL for tasks assessed/performed Overall Cognitive Status: Within Functional Limits for tasks assessed                      Exercises Total Joint Exercises Ankle Circles/Pumps: Both;10 reps;Seated Quad Sets: Both;10 reps;Seated Hip ABduction/ADduction: AAROM;Right;10 reps    General Comments General comments (skin integrity, edema, etc.): Spouse present during session.      Pertinent Vitals/Pain Pain Assessment: 0-10 Pain Score: 6  Pain Location: right thigh Pain Descriptors / Indicators: Burning;Guarding Pain Intervention(s): Monitored during session;Repositioned    Home Living  Prior Function            PT Goals (current goals can now be found in the care plan section) Progress towards PT goals: Progressing toward goals    Frequency  7X/week    PT  Plan Current plan remains appropriate    Co-evaluation             End of Session Equipment Utilized During Treatment: Gait belt Activity Tolerance: Patient tolerated treatment well Patient left: in chair;with call bell/phone within reach;with family/visitor present     Time: [id number] PT Time Calculation (min) (ACUTE ONLY): 26 min  Charges:  $Gait Training: 8-22 mins $Therapeutic Activity: 8-22 mins                    G Codes:      Khyle Goodell A Kissa Campoy 07/25/2016, 11:37 AM Mylo RedShauna Jonanthony Nahar, PT, DPT [phone]

## 2016-07-25 NOTE — Progress Notes (Signed)
CM received callback from NCM, Clydie BraunKaren [phone] who states [facility] and [facility].  No other CM needs were communicated.

## 2016-07-26 ENCOUNTER — Encounter (HOSPITAL_COMMUNITY): Payer: Self-pay | Admitting: Orthopedic Surgery

## 2016-07-27 NOTE — Care Management (Signed)
Case manager received call from [employer] requesting home health order and discharge summary be faxed to her at [phone], Home Health can not begin until received. CM Faxed to her.

## 2016-10-16 IMAGING — DX DG CHEST 2V
2 series · 2 of 2 positions shown · non-contrast
Comparison: None.

CLINICAL DATA: 45-year-old female with a history of preoperative
chest x-ray

EXAM:
CHEST  2 VIEW

[chest pa]
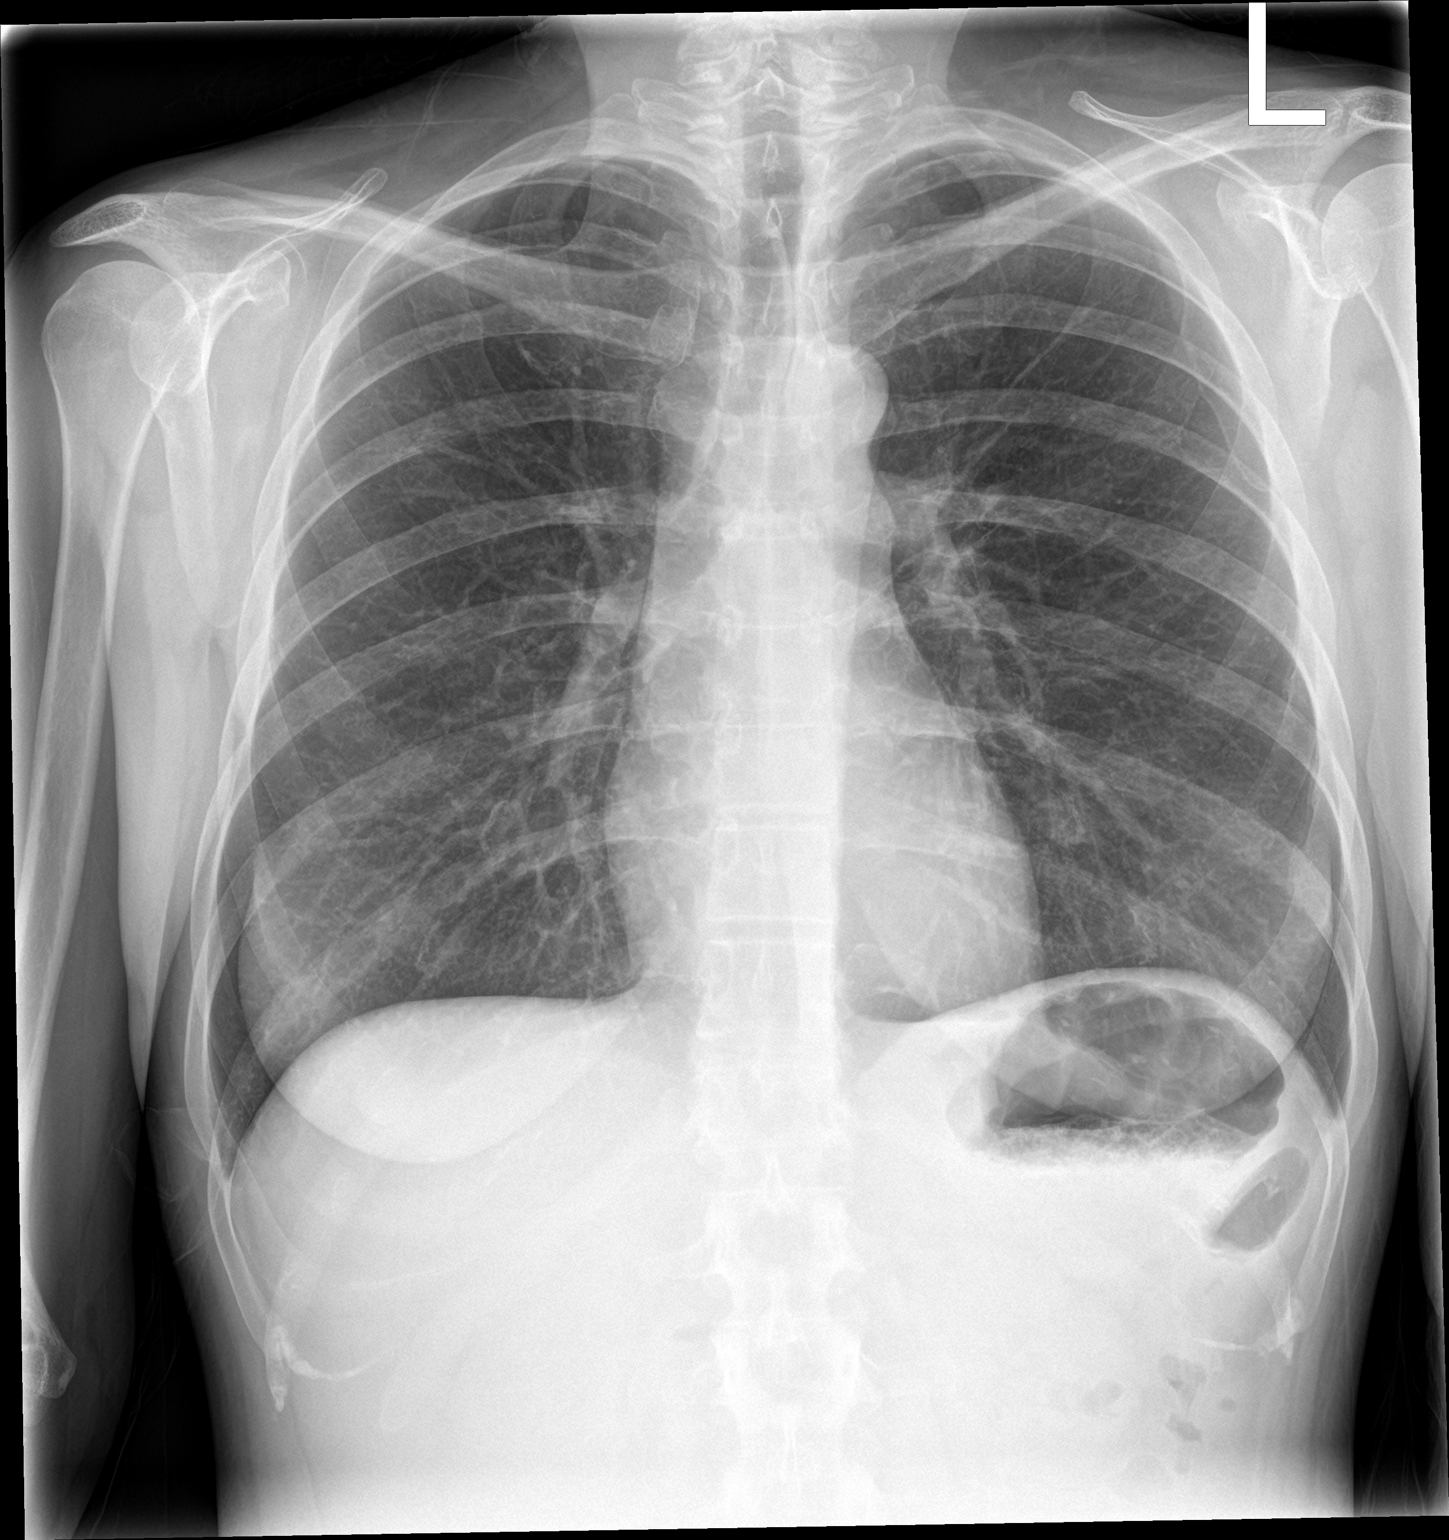

[chest lat]
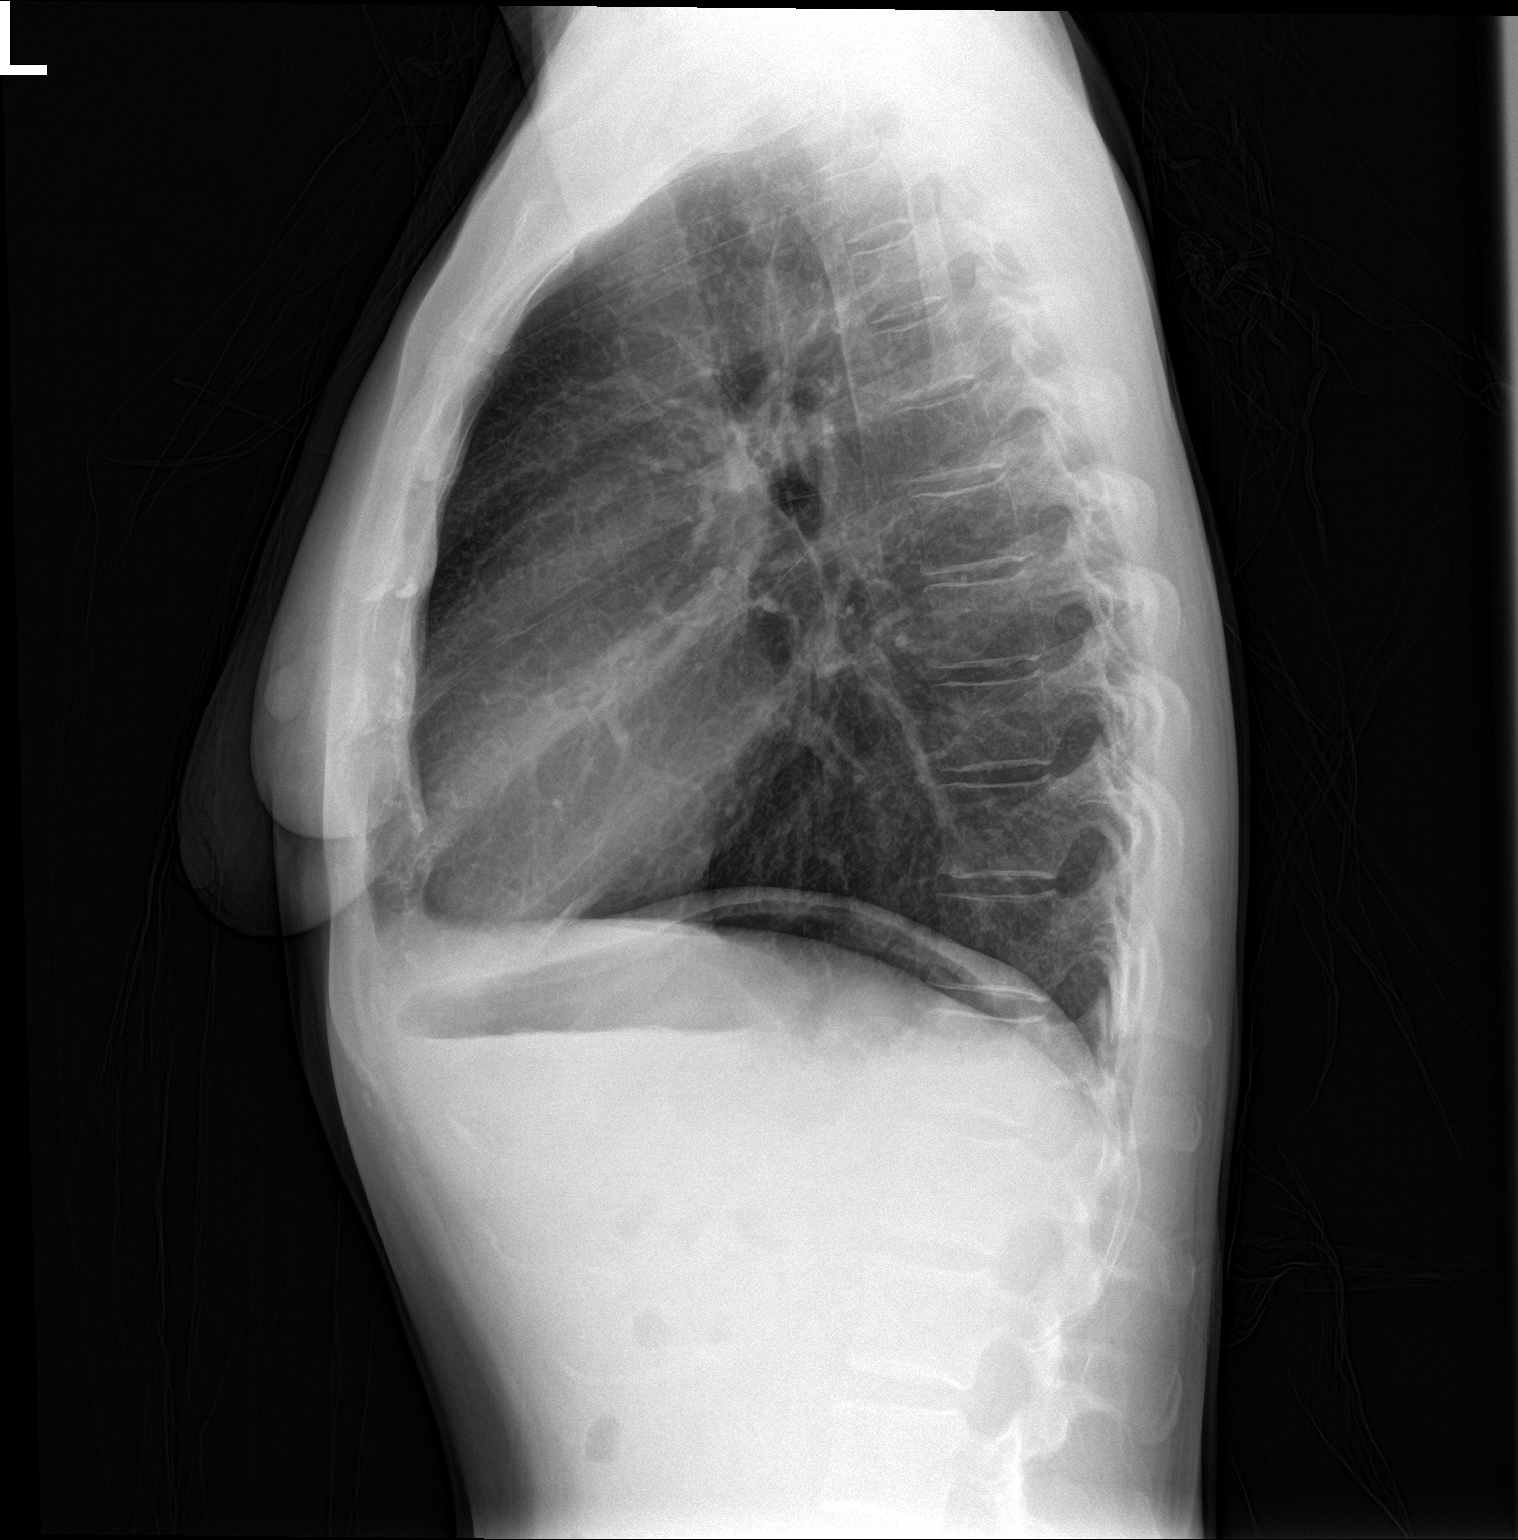

[2 of 2 positions shown; findings below may reference images not displayed]

FINDINGS: The heart size and mediastinal contours are within normal limits.
Both lungs are clear. The visualized skeletal structures are
unremarkable.
IMPRESSION: No active cardiopulmonary disease.

## 2016-10-26 IMAGING — RF DG C-ARM 61-120 MIN
1 series · 2 of 2 positions shown · non-contrast
Comparison: None.

CLINICAL DATA: Right hip replacement

EXAM:
OPERATIVE RIGHT HIP WITH PELVIS; DG C-ARM 61-120 MIN

[Series 1: run · 2 of 2 slices shown]
[im 1/2]
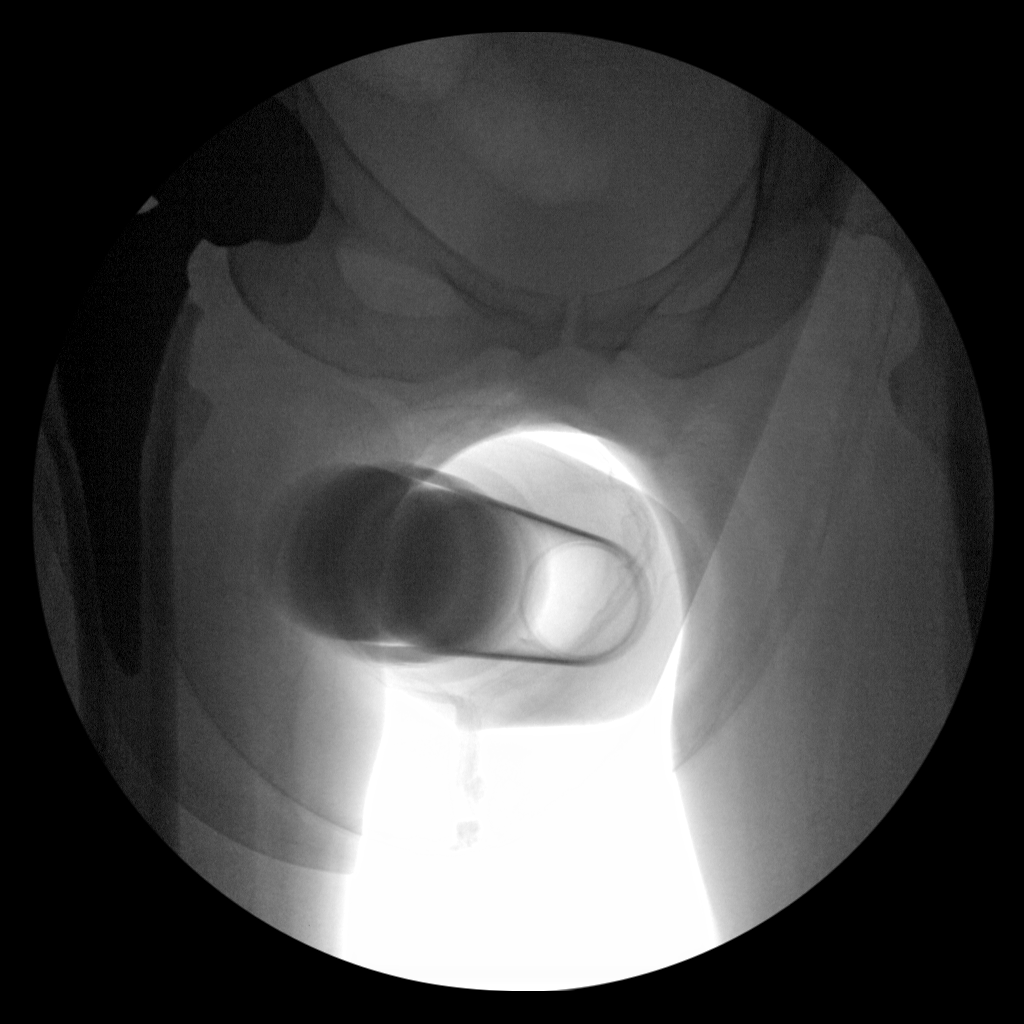
[im 2/2]
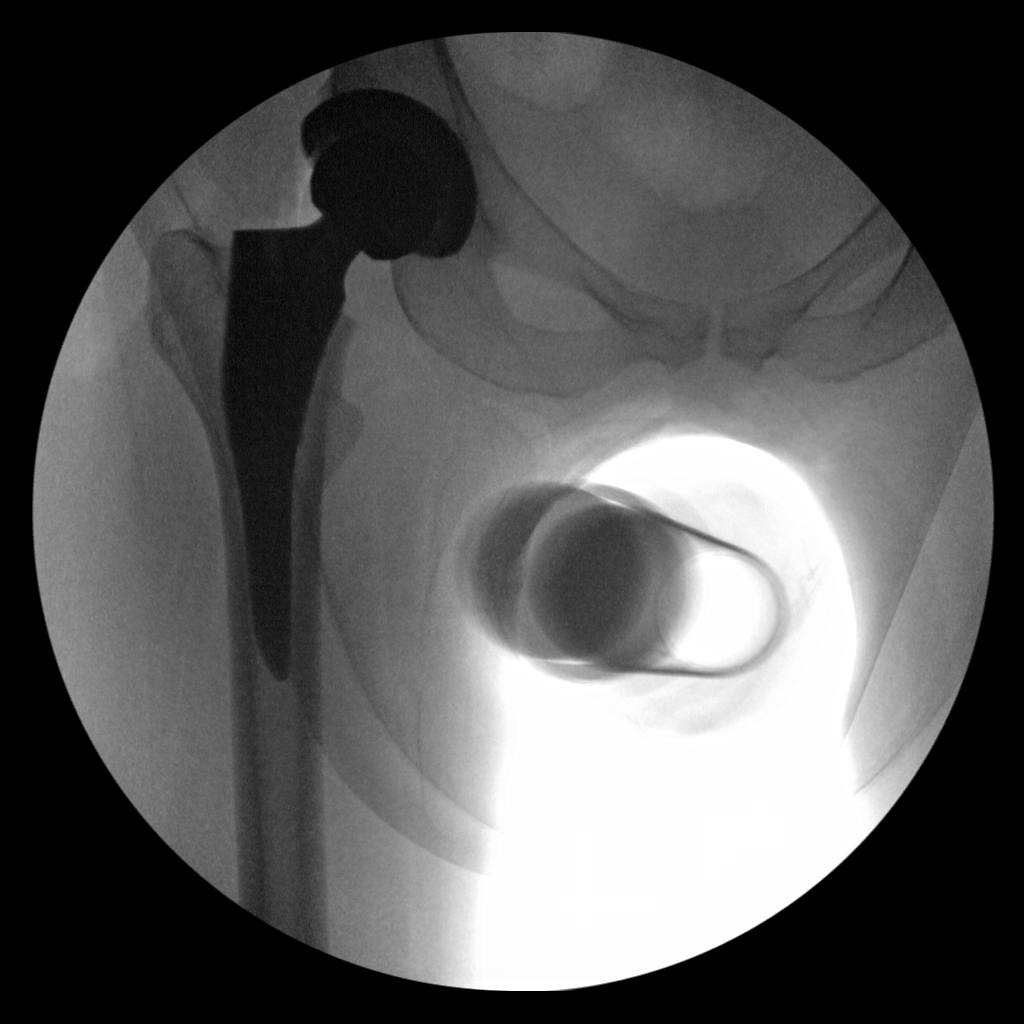

[2 of 2 positions shown; findings below may reference images not displayed]

FLUOROSCOPY TIME:  Radiation Exposure Index (as provided by the
fluoroscopic device): Not available

If the device does not provide the exposure index:

Fluoroscopy Time:  8 seconds

Number of Acquired Images:  2
FINDINGS: Right hip replacement is noted. No acute bony or soft tissue
abnormality is seen.
IMPRESSION: Status post right hip replacement.
